# Patient Record
Sex: Female | Born: 1970 | Race: White | Hispanic: No | Marital: Single | Smoking: Former smoker
Health system: Southern US, Community
[De-identification: ages and names within clinical notes are randomized; demographics above are authoritative.]

## PROBLEM LIST (undated history)

## (undated) DIAGNOSIS — G4730 Sleep apnea, unspecified: Secondary | ICD-10-CM

## (undated) DIAGNOSIS — R87629 Unspecified abnormal cytological findings in specimens from vagina: Secondary | ICD-10-CM

## (undated) DIAGNOSIS — I471 Supraventricular tachycardia, unspecified: Secondary | ICD-10-CM

## (undated) DIAGNOSIS — E119 Type 2 diabetes mellitus without complications: Secondary | ICD-10-CM

## (undated) HISTORY — PX: KNEE SURGERY: SHX244

## (undated) HISTORY — DX: Unspecified abnormal cytological findings in specimens from vagina: R87.629

## (undated) HISTORY — PX: CHOLECYSTECTOMY: SHX55

---

## 2007-04-16 ENCOUNTER — Other Ambulatory Visit: Admission: RE | Admit: 2007-04-16 | Discharge: 2007-04-16 | Payer: Self-pay | Admitting: Obstetrics and Gynecology

## 2009-04-04 ENCOUNTER — Other Ambulatory Visit: Admission: RE | Admit: 2009-04-04 | Discharge: 2009-04-04 | Payer: Self-pay | Admitting: Obstetrics and Gynecology

## 2010-07-03 ENCOUNTER — Other Ambulatory Visit (HOSPITAL_COMMUNITY)
Admission: RE | Admit: 2010-07-03 | Discharge: 2010-07-03 | Disposition: A | Discharge status: Home / Self Care | Payer: Medicaid Other | Source: Ambulatory Visit | Attending: Obstetrics & Gynecology | Admitting: Obstetrics & Gynecology

## 2010-07-03 DIAGNOSIS — Z01419 Encounter for gynecological examination (general) (routine) without abnormal findings: Secondary | ICD-10-CM | POA: Insufficient documentation

## 2012-10-07 ENCOUNTER — Other Ambulatory Visit: Payer: Self-pay | Admitting: Obstetrics & Gynecology

## 2012-11-10 ENCOUNTER — Other Ambulatory Visit (HOSPITAL_COMMUNITY)
Admission: RE | Admit: 2012-11-10 | Discharge: 2012-11-10 | Disposition: A | Discharge status: Home / Self Care | Payer: Self-pay | Source: Ambulatory Visit | Attending: Obstetrics & Gynecology | Admitting: Obstetrics & Gynecology

## 2012-11-10 ENCOUNTER — Ambulatory Visit (INDEPENDENT_AMBULATORY_CARE_PROVIDER_SITE_OTHER): Payer: Self-pay | Admitting: Obstetrics & Gynecology

## 2012-11-10 ENCOUNTER — Encounter: Payer: Self-pay | Admitting: Obstetrics & Gynecology

## 2012-11-10 VITALS — BP 130/100 | Ht 66.0 in | Wt 285.0 lb

## 2012-11-10 DIAGNOSIS — Z01419 Encounter for gynecological examination (general) (routine) without abnormal findings: Secondary | ICD-10-CM

## 2012-11-10 DIAGNOSIS — R87810 Cervical high risk human papillomavirus (HPV) DNA test positive: Secondary | ICD-10-CM | POA: Insufficient documentation

## 2012-11-10 DIAGNOSIS — Z1151 Encounter for screening for human papillomavirus (HPV): Secondary | ICD-10-CM | POA: Insufficient documentation

## 2012-11-10 MED ORDER — ESCITALOPRAM OXALATE 20 MG PO TABS: 20.0000 mg | ORAL_TABLET | Freq: Every day | ORAL | Status: DC

## 2012-11-10 NOTE — Addendum Note (Signed)
Addended by: Colen Darling on: 11/10/2012 10:53 AM   Modules accepted: Orders

## 2012-11-10 NOTE — Progress Notes (Signed)
Patient ID: Christina Estrada, female   DOB: 1971/02/24, 42 y.o.   MRN: [id number] Subjective:     Christina Estrada is a 42 y.o. female here for a routine exam.  Patient's last menstrual period was 10/31/2012. G1P1001 Current complaints: none.  Personal health questionnaire reviewed: no.   Gynecologic History Patient's last menstrual period was 10/31/2012. Contraception: abstinence Last Pap: 2013. Results were: normal Last mammogram: 2013. Results were: normal  Obstetric History OB History   Grav Para Term Preterm Abortions TAB SAB Ect Mult Living   1 1 1  0 0 0 0 0 0 1     # Outc Date GA Lbr Len/2nd Wgt Sex Del Anes PTL Lv   1 TRM      LTCS          The following portions of the patient's history were reviewed and updated as appropriate: allergies, current medications, past family history, past medical history, past social history, past surgical history and problem list.  Review of Systems  Review of Systems  Constitutional: Negative for fever, chills, weight loss, malaise/fatigue and diaphoresis.  HENT: Negative for hearing loss, ear pain, nosebleeds, congestion, sore throat, neck pain, tinnitus and ear discharge.   Eyes: Negative for blurred vision, double vision, photophobia, pain, discharge and redness.  Respiratory: Negative for cough, hemoptysis, sputum production, shortness of breath, wheezing and stridor.   Cardiovascular: Negative for chest pain, palpitations, orthopnea, claudication, leg swelling and PND.  Gastrointestinal: negative for abdominal pain. Negative for heartburn, nausea, vomiting, diarrhea, constipation, blood in stool and melena.  Genitourinary: Negative for dysuria, urgency, frequency, hematuria and flank pain.  Musculoskeletal: Negative for myalgias, back pain, joint pain and falls.  Skin: Negative for itching and rash.  Neurological: Negative for dizziness, tingling, tremors, sensory change, speech change, focal weakness, seizures, loss of consciousness,  weakness and headaches.  Endo/Heme/Allergies: Negative for environmental allergies and polydipsia. Does not bruise/bleed easily.  Psychiatric/Behavioral: Negative for depression, suicidal ideas, hallucinations, memory loss and substance abuse. The patient is not nervous/anxious and does not have insomnia.        Objective:    Physical Exam  Vitals reviewed. Constitutional: She is oriented to person, place, and time. She appears well-developed and well-nourished.  HENT:  Head: Normocephalic and atraumatic.        Right Ear: External ear normal.  Left Ear: External ear normal.  Nose: Nose normal.  Mouth/Throat: Oropharynx is clear and moist.  Eyes: Conjunctivae and EOM are normal. Pupils are equal, round, and reactive to light. Right eye exhibits no discharge. Left eye exhibits no discharge. No scleral icterus.  Neck: Normal range of motion. Neck supple. No tracheal deviation present. No thyromegaly present.  Cardiovascular: Normal rate, regular rhythm, normal heart sounds and intact distal pulses.  Exam reveals no gallop and no friction rub.   No murmur heard. Respiratory: Effort normal and breath sounds normal. No respiratory distress. She has no wheezes. She has no rales. She exhibits no tenderness.  GI: Soft. Bowel sounds are normal. She exhibits no distension and no mass. There is no tenderness. There is no rebound and no guarding.  Genitourinary:       Vulva is normal without lesions Vagina is pink moist without discharge Cervix normal in appearance and pap is done Uterus is normal size shape and contour Adnexa is negative with normal sized ovaries   Musculoskeletal: Normal range of motion. She exhibits no edema and no tenderness.  Neurological: She is alert and oriented to person, place,  and time. She has normal reflexes. She displays normal reflexes. No cranial nerve deficit. She exhibits normal muscle tone. Coordination normal.  Skin: Skin is warm and dry. No rash noted. No  erythema. No pallor.  Psychiatric: She has a normal mood and affect. Her behavior is normal. Judgment and thought content normal.       Assessment:    Healthy female exam.    Plan:    Contraception: abstinence. Mammogram ordered. Follow up in: 1 year.

## 2012-11-10 NOTE — Patient Instructions (Signed)
Mammogram [id number] 

## 2012-11-25 ENCOUNTER — Ambulatory Visit (INDEPENDENT_AMBULATORY_CARE_PROVIDER_SITE_OTHER): Payer: Self-pay | Admitting: Obstetrics & Gynecology

## 2012-11-25 ENCOUNTER — Encounter: Payer: Self-pay | Admitting: Obstetrics & Gynecology

## 2012-11-25 VITALS — BP 140/100 | Wt 289.0 lb

## 2012-11-25 DIAGNOSIS — R87610 Atypical squamous cells of undetermined significance on cytologic smear of cervix (ASC-US): Secondary | ICD-10-CM

## 2012-11-25 DIAGNOSIS — IMO0002 Reserved for concepts with insufficient information to code with codable children: Secondary | ICD-10-CM | POA: Insufficient documentation

## 2012-11-25 DIAGNOSIS — R87810 Cervical high risk human papillomavirus (HPV) DNA test positive: Secondary | ICD-10-CM

## 2012-11-25 NOTE — Progress Notes (Signed)
Patient ID: Christina Estrada, female   DOB: October 25, 1970, 42 y.o.   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

## 2012-11-25 NOTE — Patient Instructions (Signed)
Cervical Dysplasia  Cervical dysplasia is a condition in which a woman has abnormal changes in the cells of her cervix. The cervix is the opening to the uterus (womb) between the vagina and the uterus. These changes are called cervical dysplasia and may be the first signs of cervical cancer. These cells can be taken from the cervix during a Pap test and then looked at under a microscope.  With early detection, treatment, and close follow-up care, nearly all cervical dysplasia can be cured. If untreated, the mild to moderate stages of dysplasia often grow more severe.   RISK FACTORS   The following increase the risk for cervical dysplasia.  · Having had a sexually transmitted disease, including:  · Chlamydia.  · Human papilloma virus (HPV).  · Becoming sexually active before age 18.  · Having had more than 1 sexual partner.  · Not using protection, such as condoms, during sexual intercourse, especially with new sexual partners.  · Having had cancer of the vagina or vulva.  · Having a sexual partner whose previous partner had cancer of the cervix or cervical dysplasia.  · Having a sexual partner who has or has had cancer of the penis.  · Having a weakened immune system (HIV, organ transplant).  · Being the daughter of a woman who took DES (diethylstilbestrol) during pregnancy.  · A history of cervical cancer in a woman's sister or mother.  · Smoking.  · Having had an abnormal Pap test in the past.  SYMPTOMS   There are usually no symptoms. If there are symptoms, they may be vague such as:  · Abnormal vaginal discharge.  · Bleeding between periods or following intercourse.  · Bleeding during menopause.  · Pain on intercourse (dyspareunia).  DIAGNOSIS   · The Pap test is the best way of detecting abnormalities of the cervix.  · Biopsy (removing a piece of tissue to look at under the microscope) of the cervix when the Pap test is abnormal or when the Pap test is normal, but the cervix looks abnormal.  TREATMENT    Catching and treating the changes early with Pap tests can prevent cervical cancer.  · Cryotherapy freezes the abnormal cells with a steel tip instrument.  · A laser can be used to remove the abnormal cells.  · Loop electrocautery excision procedure (LEEP). This procedure uses a heated electrical loop to remove a cone-like portion of the cervix, including the cervical canal.  · For more serious cases of cervical dysplasia, the abnormal tissue may be removed surgically by:  · A cone biopsy (by cold knife, laser or LEEP). A procedure in which a portion of the center of the cervix with the cervical canal is removed.  · The uterus and cervix are removed (hysterectomy).  Your caregiver will advise you regarding the need and timing of Pap tests in your follow-up. Women who have been treated for dysplasia should be closely followed with pelvic exams and Pap tests. During the first year following treatment of cervical dysplasia, Pap tests should be done every 3 to 4 months. In the second year, the schedule is every 6 months, or as recommended by your caregiver. See your caregiver for new or worsening problems.  HOME CARE INSTRUCTIONS   · Follow the instructions and recommendations of your caregiver regarding medicines and follow-up appointments.  · Only take over-the-counter or prescription medicines for pain or discomfort as directed by your caregiver.  · Cramping and pelvic discomfort may follow cryotherapy. It   is not abnormal to have watery discharge for several weeks after.  · Laser, cone surgery, cryotherapy or LEEP can cause a bad smelling vaginal discharge. It may also cause vaginal bleeding for a couple weeks following the procedure. The discharge may be black from the paste used to control bleeding from the cone site. This is normal.  · Do not use tampons, have sexual intercourse or douche until your caregiver says it is okay.  SEEK MEDICAL CARE IF:   · You develop genital warts.  · You need a prescription for  pain medicine following your treatment.  SEEK IMMEDIATE MEDICAL CARE IF:   · Your bleeding is heavier than a normal menstrual period.  · You develop bright red bleeding, especially if you have blood clots.  · You have a fever.  · You have increasing cramps or pain not relieved with medicine.  · You are lightheaded, unusually weak, or have fainting spells.  · You have abnormal vaginal discharge.  · You develop abdominal pain.  PREVENTION   · The surest way to prevent cervical dysplasia is to abstain from sexual intercourse.  · Practice safe sex, use condoms and have only one sex partner who does not have other sex partners.  · A Pap test is done to screen for cervical cancer.  · The first Pap test should be done at age 21.  · Between ages 21 and 29, Pap tests are repeated every 2 years.  · Beginning at age 30, you are advised to have a Pap test every 3 years as long as your past 3 Pap tests have been normal.  · Some women have medical problems that increase the chance of getting cervical cancer. Talk to your caregiver about these problems. It is especially important to talk to your caregiver if a new problem develops soon after your last Pap test. In these cases, your caregiver may recommend more frequent screening and Pap tests.  · The above recommendations are the same for women who have or have not gotten the vaccine for HPV (Human Papillomavirus).  · If you had a hysterectomy for a problem that was not a cancer or a condition that could lead to cancer, then you no longer need Pap tests. However, even if you no longer need a Pap test, a regular exam is a good idea to make sure no other problems are starting.     · If you are between ages 65 and 70, and you have had normal Pap tests going back 10 years, you no longer need Pap tests. However, even if you no longer need a Pap test, a regular exam is a good idea to make sure no other problems are starting.     · If you have had past treatment for cervical cancer or a  condition that could lead to cancer, you need Pap tests and screening for cancer for at least 20 years after your treatment.   · If Pap tests have been discontinued, risk factors (such as a new sexual partner)  need to be re-assessed to determine if screening should be resumed.  · Some women may need screenings more often if they are at high risk for cervical cancer.  · Your caregiver may do additional tests including:  · Colposcopy. A procedure in which a special microscope magnifies the cells and allows the provider to closely examine the cervix, vagina, and vulva.  · Biopsy. A small tissue sample is taken from the cervix, vagina or vulva. This is generally done in your   caregivers office.  · A cone biopsy (cold knife or laser). A large tissue sample is taken from the cervix. This procedure is usually done in an operating room under a general anesthetic. The cone often removes all abnormal tissue and so may also complete the treatment.  · LEEP, also removing a circular portion of the cervix and is done in a doctors office under a local anesthetic.  · Now there is a vaccine, Gardasil, that was developed to prevent the HPV'S that can cause cancer of the cervix and genital warts. It is recommended for females ages 9 to 26. It should not be given to pregnant women until more is known about its effects on the fetus. Not all cancers of the cervix are caused by the HPV. Routine gynecology exams and Pap tests should continue as recommended by your caregiver.  Document Released: 03/19/2005 Document Revised: 06/11/2011 Document Reviewed: 03/10/2008  [employer]® Patient Information ©2014 [employer].

## 2013-06-03 ENCOUNTER — Other Ambulatory Visit (HOSPITAL_COMMUNITY): Payer: Self-pay

## 2013-06-03 ENCOUNTER — Other Ambulatory Visit: Payer: Self-pay | Admitting: Physician Assistant

## 2013-06-03 DIAGNOSIS — G4730 Sleep apnea, unspecified: Secondary | ICD-10-CM

## 2013-07-10 ENCOUNTER — Ambulatory Visit: Payer: BC Managed Care – PPO | Attending: Neurology | Admitting: Sleep Medicine

## 2013-07-10 ENCOUNTER — Encounter: Payer: Self-pay | Admitting: Neurology

## 2013-07-10 VITALS — Ht 67.2 in | Wt 290.0 lb

## 2013-07-10 DIAGNOSIS — Z6841 Body Mass Index (BMI) 40.0 and over, adult: Secondary | ICD-10-CM | POA: Insufficient documentation

## 2013-07-10 DIAGNOSIS — G4730 Sleep apnea, unspecified: Secondary | ICD-10-CM

## 2013-07-10 DIAGNOSIS — G4733 Obstructive sleep apnea (adult) (pediatric): Secondary | ICD-10-CM | POA: Insufficient documentation

## 2013-07-16 NOTE — Sleep Study (Signed)
  Nelson A. Merlene Laughter, MD     [email or web address]          LOCATION: SLEEP LAB FACILITY: [facility]  PHYSICIAN: Paizley Ramella A. Merlene Laughter, M.D.   DATE OF STUDY: 07/10/13  NOCTURNAL POLYSOMNOGRAM   REFERRING PHYSICIAN: Rhone Ozaki.  INDICATIONS: This is a 43 year old presents with obesity, snoring and witnessed apnea.  MEDICATIONS:  Prior to Admission medications   Medication Sig Start Date End Date Taking? Authorizing Provider  escitalopram (LEXAPRO) 20 MG tablet Take 1 tablet (20 mg total) by mouth daily. 11/10/12   Florian Buff, MD      EPWORTH SLEEPINESS SCALE: 7.   BMI: 47.   ARCHITECTURAL SUMMARY: This is a split-night recording with initial portion been a diagnostic and a second person at titration recording.Total recording time was 421 minutes. Sleep efficiency 87 %. Sleep latency 13 minutes. REM latency 197 minutes. Stage NI 2 %, N2 57 % and N3 18 % and REM sleep 22 %.    RESPIRATORY DATA:  Baseline oxygen saturation is 96 %. The lowest saturation is 61 %. The diagnostic AHI is 81. The patient was started on positive pressure starting at 5 and increase to 10. The optimal pressures 10 with resolution of obstructive events and good tolerance.  LIMB MOVEMENT SUMMARY: PLM index 0.   ELECTROCARDIOGRAM SUMMARY: Average heart rate is 55 with no significant dysrhythmias observed.   IMPRESSION:  1. Severe obstructive sleep apnea syndrome which responds well to the CPAP of 10.  Thanks for this referral.  Soren Lazarz A. Merlene Laughter, M.D. Diplomat, [employer] adviser of [employer] Medicine.

## 2014-02-01 ENCOUNTER — Encounter: Payer: Self-pay | Admitting: Obstetrics & Gynecology

## 2014-02-02 ENCOUNTER — Other Ambulatory Visit (HOSPITAL_COMMUNITY): Payer: Self-pay | Admitting: Orthopedic Surgery

## 2014-02-02 DIAGNOSIS — M25561 Pain in right knee: Secondary | ICD-10-CM

## 2014-02-04 ENCOUNTER — Ambulatory Visit (HOSPITAL_COMMUNITY): Payer: BC Managed Care – PPO

## 2014-02-18 ENCOUNTER — Ambulatory Visit (HOSPITAL_COMMUNITY): Payer: BC Managed Care – PPO | Attending: Orthopedic Surgery

## 2014-09-08 ENCOUNTER — Emergency Department (HOSPITAL_COMMUNITY): Payer: Medicaid Other

## 2014-09-08 ENCOUNTER — Encounter (HOSPITAL_COMMUNITY): Payer: Self-pay | Admitting: Emergency Medicine

## 2014-09-08 ENCOUNTER — Emergency Department (HOSPITAL_COMMUNITY)
Admission: EM | Admit: 2014-09-08 | Discharge: 2014-09-08 | Disposition: A | Discharge status: Home / Self Care | Payer: Medicaid Other | Attending: Emergency Medicine | Admitting: Emergency Medicine

## 2014-09-08 DIAGNOSIS — M25461 Effusion, right knee: Secondary | ICD-10-CM | POA: Insufficient documentation

## 2014-09-08 DIAGNOSIS — M25561 Pain in right knee: Secondary | ICD-10-CM | POA: Insufficient documentation

## 2014-09-08 DIAGNOSIS — Z87891 Personal history of nicotine dependence: Secondary | ICD-10-CM | POA: Insufficient documentation

## 2014-09-08 DIAGNOSIS — Z79899 Other long term (current) drug therapy: Secondary | ICD-10-CM | POA: Insufficient documentation

## 2014-09-08 DIAGNOSIS — M25471 Effusion, right ankle: Secondary | ICD-10-CM | POA: Insufficient documentation

## 2014-09-08 LAB — CBC WITH DIFFERENTIAL/PLATELET
BASOS ABS: 0 10*3/uL (ref 0.0–0.1)
Basophils Relative: 0 % (ref 0–1)
EOS ABS: 0.3 10*3/uL (ref 0.0–0.7)
Eosinophils Relative: 2 % (ref 0–5)
HCT: 40.9 % (ref 36.0–46.0)
Hemoglobin: 13.6 g/dL (ref 12.0–15.0)
LYMPHS PCT: 23 % (ref 12–46)
Lymphs Abs: 2.8 10*3/uL (ref 0.7–4.0)
MCH: 28.9 pg (ref 26.0–34.0)
MCHC: 33.3 g/dL (ref 30.0–36.0)
MCV: 87 fL (ref 78.0–100.0)
Monocytes Absolute: 0.8 10*3/uL (ref 0.1–1.0)
Monocytes Relative: 7 % (ref 3–12)
Neutro Abs: 8.2 10*3/uL — ABNORMAL HIGH (ref 1.7–7.7)
Neutrophils Relative %: 68 % (ref 43–77)
Platelets: 235 10*3/uL (ref 150–400)
RBC: 4.7 MIL/uL (ref 3.87–5.11)
RDW: 13.6 % (ref 11.5–15.5)
WBC: 12.1 10*3/uL — ABNORMAL HIGH (ref 4.0–10.5)

## 2014-09-08 LAB — BASIC METABOLIC PANEL
Anion gap: 6 (ref 5–15)
BUN: 9 mg/dL (ref 6–20)
CO2: 26 mmol/L (ref 22–32)
Calcium: 8.6 mg/dL — ABNORMAL LOW (ref 8.9–10.3)
Chloride: 106 mmol/L (ref 101–111)
Creatinine, Ser: 0.8 mg/dL (ref 0.44–1.00)
GFR calc Af Amer: >60 mL/min (ref >60)
GFR calc non Af Amer: >60 mL/min (ref >60)
Glucose, Bld: 92 mg/dL (ref 65–99)
Potassium: 3.6 mmol/L (ref 3.5–5.1)
SODIUM: 138 mmol/L (ref 135–145)

## 2014-09-08 LAB — URIC ACID: Uric Acid, Serum: 3 mg/dL (ref 2.3–6.6)

## 2014-09-08 MED ORDER — METHYLPREDNISOLONE SODIUM SUCC 125 MG IJ SOLR
125.0000 mg | Freq: Once | INTRAMUSCULAR | Status: AC
  Administered 2014-09-08: 125 mg via INTRAMUSCULAR
  Filled 2014-09-08: qty 2

## 2014-09-08 MED ORDER — PREDNISONE 10 MG PO TABS: 20.0000 mg | ORAL_TABLET | Freq: Every day | ORAL | Status: DC

## 2014-09-08 MED ORDER — HYDROCODONE-ACETAMINOPHEN 5-325 MG PO TABS: 1.0000 | ORAL_TABLET | Freq: Four times a day (QID) | ORAL | Status: DC | PRN

## 2014-09-08 NOTE — Discharge Instructions (Signed)
Follow up with dr. Harrison next week. °

## 2014-09-08 NOTE — ED Notes (Signed)
Pt reports right knee swelling and right ankle swelling. Pt reports history of same. Pt denies any known injury.

## 2014-09-08 NOTE — ED Provider Notes (Signed)
CSN: [id number]     Arrival date & time 09/08/14  1613 History   First MD Initiated Contact with Patient 09/08/14 1700     Chief Complaint  Patient presents with  . Joint Swelling     (Consider location/radiation/quality/duration/timing/severity/associated sxs/prior Treatment) Patient is a 44 y.o. female presenting with knee pain. The history is provided by the patient (the pt complains of right knee and ankle pain).  Knee Pain Lower extremity pain location: right kneee. Pain details:    Radiates to:  Does not radiate   Severity:  Moderate   Onset quality:  Sudden   Timing:  Constant Associated symptoms: no back pain and no fatigue     History reviewed. No pertinent past medical history. Past Surgical History  Procedure Laterality Date  . Cesarean section    . Cholecystectomy     Family History  Problem Relation Age of Onset  . Hypertension Father   . Diabetes Neg Hx    History  Substance Use Topics  . Smoking status: Former Smoker -- 0.25 packs/day  . Smokeless tobacco: Not on file     Comment: never used snuff or chewing tobacco.  . Alcohol Use: Yes     Comment: occasional    OB History    Gravida Para Term Preterm AB TAB SAB Ectopic Multiple Living   1 1 1  0 0 0 0 0 0 1     Review of Systems  Constitutional: Negative for appetite change and fatigue.  HENT: Negative for congestion, ear discharge and sinus pressure.   Eyes: Negative for discharge.  Respiratory: Negative for cough.   Cardiovascular: Negative for chest pain.  Gastrointestinal: Negative for abdominal pain and diarrhea.  Genitourinary: Negative for frequency and hematuria.  Musculoskeletal: Negative for back pain.       Right knee and ankle pain  Skin: Negative for rash.  Neurological: Negative for seizures and headaches.  Psychiatric/Behavioral: Negative for hallucinations.      Allergies  Review of patient's allergies indicates no known allergies.  Home Medications   Prior to Admission  medications   Medication Sig Start Date End Date Taking? Authorizing Provider  ibuprofen (ADVIL,MOTRIN) 200 MG tablet Take 600-800 mg by mouth every 6 (six) hours as needed for mild pain or moderate pain.   Yes Historical Provider, MD  escitalopram (LEXAPRO) 20 MG tablet Take 1 tablet (20 mg total) by mouth daily. Patient not taking: Reported on 09/08/2014 11/10/12   Florian Buff, MD  HYDROcodone-acetaminophen (NORCO/VICODIN) 5-325 MG per tablet Take 1 tablet by mouth every 6 (six) hours as needed. 09/08/14   Milton Ferguson, MD  predniSONE (DELTASONE) 10 MG tablet Take 2 tablets (20 mg total) by mouth daily. 09/08/14   Milton Ferguson, MD   BP 141/84 mmHg  Pulse 78  Temp(Src) 98.2 F (36.8 C) (Oral)  Resp 18  Ht 5\' 7"  (1.702 m)  Wt 280 lb (127.007 kg)  BMI 43.84 kg/m2  SpO2 98%  LMP 08/24/2014 Physical Exam  Constitutional: She is oriented to person, place, and time. She appears well-developed.  HENT:  Head: Normocephalic.  Eyes: Conjunctivae and EOM are normal. No scleral icterus.  Neck: Neck supple. No thyromegaly present.  Cardiovascular: Normal rate and regular rhythm.  Exam reveals no gallop and no friction rub.   No murmur heard. Pulmonary/Chest: No stridor. She has no wheezes. She has no rales. She exhibits no tenderness.  Abdominal: She exhibits no distension. There is no tenderness. There is no rebound.  Musculoskeletal:  She exhibits edema.  Moderately swollen tender right knee and ankle  Lymphadenopathy:    She has no cervical adenopathy.  Neurological: She is oriented to person, place, and time. She exhibits normal muscle tone. Coordination normal.  Skin: No rash noted. No erythema.  Psychiatric: She has a normal mood and affect. Her behavior is normal.    ED Course  Procedures (including critical care time) Labs Review Labs Reviewed  CBC WITH DIFFERENTIAL/PLATELET - Abnormal; Notable for the following:    WBC 12.1 (*)    Neutro Abs 8.2 (*)    All other components within  normal limits  BASIC METABOLIC PANEL - Abnormal; Notable for the following:    Calcium 8.6 (*)    All other components within normal limits  URIC ACID    Imaging Review Dg Ankle Complete Right  09/08/2014   CLINICAL DATA:  Anterior right knee pain. Medial right ankle pain. Chronic ankle pain worsening.  EXAM: RIGHT ANKLE - COMPLETE 3+ VIEW  COMPARISON:  None.  FINDINGS: Ankle mortise intact. The talar dome is normal. No malleolar fracture. The calcaneus is normal. No significant arthropathy.  IMPRESSION: No acute osseous abnormality.   Electronically Signed   By: Suzy Bouchard M.D.   On: 09/08/2014 18:00   Dg Knee Complete 4 Views Right  09/08/2014   CLINICAL DATA:  44 year old female with anterior and lateral right-sided knee pain and swelling. Symptoms have been present intermittently over the past 30 years, but have worsened over the past month.  EXAM: RIGHT KNEE - COMPLETE 4+ VIEW  COMPARISON:  No priors.  FINDINGS: Subtle areas of sclerosis in both the medial and lateral tibial metaphysis. There is no evidence of fracture, dislocation, or joint effusion. There is no evidence of arthropathy or other focal bone abnormality. Soft tissues are unremarkable. Mild joint space narrowing, subchondral sclerosis and osteophyte formation is noted in a tricompartmental distribution, compatible with mild osteoarthritis.  IMPRESSION: 1. Subtle areas of sclerosis in the tibial metaphysis are of uncertain etiology and significance, but could be related to chronic bone infarcts. 2. No acute findings. 3. Mild tricompartmental osteoarthritis.   Electronically Signed   By: Vinnie Langton M.D.   On: 09/08/2014 18:00     EKG Interpretation None      MDM   Final diagnoses:  Knee pain, acute, right    Arthritic pain,  tx with prednisone,  hydrocone and ortho follow up    Milton Ferguson, MD 09/08/14 2020

## 2014-09-08 NOTE — ED Notes (Signed)
Patient verbalizes understanding of discharge instructions, prescription medications, home care and follow up care. Patient out of department at this time. 

## 2014-12-28 ENCOUNTER — Emergency Department (HOSPITAL_COMMUNITY)
Admission: EM | Admit: 2014-12-28 | Discharge: 2014-12-28 | Disposition: A | Discharge status: Home / Self Care | Payer: Medicaid Other | Attending: Emergency Medicine | Admitting: Emergency Medicine

## 2014-12-28 ENCOUNTER — Encounter (HOSPITAL_COMMUNITY): Payer: Self-pay | Admitting: Cardiology

## 2014-12-28 DIAGNOSIS — S4992XA Unspecified injury of left shoulder and upper arm, initial encounter: Secondary | ICD-10-CM | POA: Insufficient documentation

## 2014-12-28 DIAGNOSIS — S0003XA Contusion of scalp, initial encounter: Secondary | ICD-10-CM | POA: Insufficient documentation

## 2014-12-28 DIAGNOSIS — Y998 Other external cause status: Secondary | ICD-10-CM | POA: Insufficient documentation

## 2014-12-28 DIAGNOSIS — Z7952 Long term (current) use of systemic steroids: Secondary | ICD-10-CM | POA: Insufficient documentation

## 2014-12-28 DIAGNOSIS — Z87891 Personal history of nicotine dependence: Secondary | ICD-10-CM | POA: Insufficient documentation

## 2014-12-28 DIAGNOSIS — S40812A Abrasion of left upper arm, initial encounter: Secondary | ICD-10-CM | POA: Insufficient documentation

## 2014-12-28 DIAGNOSIS — M25512 Pain in left shoulder: Secondary | ICD-10-CM

## 2014-12-28 DIAGNOSIS — Y92410 Unspecified street and highway as the place of occurrence of the external cause: Secondary | ICD-10-CM | POA: Insufficient documentation

## 2014-12-28 DIAGNOSIS — S3992XA Unspecified injury of lower back, initial encounter: Secondary | ICD-10-CM | POA: Insufficient documentation

## 2014-12-28 DIAGNOSIS — S199XXA Unspecified injury of neck, initial encounter: Secondary | ICD-10-CM | POA: Insufficient documentation

## 2014-12-28 DIAGNOSIS — T148XXA Other injury of unspecified body region, initial encounter: Secondary | ICD-10-CM

## 2014-12-28 DIAGNOSIS — Y9389 Activity, other specified: Secondary | ICD-10-CM | POA: Insufficient documentation

## 2014-12-28 DIAGNOSIS — Z8669 Personal history of other diseases of the nervous system and sense organs: Secondary | ICD-10-CM | POA: Insufficient documentation

## 2014-12-28 HISTORY — DX: Sleep apnea, unspecified: G47.30

## 2014-12-28 MED ORDER — KETOROLAC TROMETHAMINE 30 MG/ML IJ SOLN
30.0000 mg | Freq: Once | INTRAMUSCULAR | Status: AC
  Administered 2014-12-28: 30 mg via INTRAVENOUS
  Filled 2014-12-28: qty 1

## 2014-12-28 NOTE — Discharge Instructions (Signed)
Keep wounds clean.  If you were given medicines take as directed.  If you are on coumadin or contraceptives realize their levels and effectiveness is altered by many different medicines.  If you have any reaction (rash, tongues swelling, other) to the medicines stop taking and see a physician.    If your blood pressure was elevated in the ER make sure you follow up for management with a primary doctor or return for chest pain, shortness of breath or stroke symptoms.  Please follow up as directed and return to the ER or see a physician for new or worsening symptoms.  Thank you. Filed Vitals:   12/28/14 0843  BP: 148/75  Pulse: 92  Temp: 97.9 F (36.6 C)  TempSrc: Oral  Resp: 13  Height: 5\' 7"  (1.702 m)  Weight: 280 lb (127.007 kg)  SpO2: 97%
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
## 2014-12-28 NOTE — ED Notes (Signed)
Cleaned scrapes to bilateral arms.

## 2015-04-29 ENCOUNTER — Other Ambulatory Visit (HOSPITAL_COMMUNITY): Payer: Self-pay | Admitting: Respiratory Therapy

## 2015-04-29 DIAGNOSIS — I10 Essential (primary) hypertension: Secondary | ICD-10-CM

## 2015-04-29 DIAGNOSIS — G4733 Obstructive sleep apnea (adult) (pediatric): Secondary | ICD-10-CM

## 2015-08-03 DIAGNOSIS — M1712 Unilateral primary osteoarthritis, left knee: Secondary | ICD-10-CM | POA: Diagnosis not present

## 2015-09-22 DIAGNOSIS — M1712 Unilateral primary osteoarthritis, left knee: Secondary | ICD-10-CM | POA: Diagnosis not present

## 2015-10-03 DIAGNOSIS — S93401A Sprain of unspecified ligament of right ankle, initial encounter: Secondary | ICD-10-CM | POA: Diagnosis not present

## 2015-10-03 DIAGNOSIS — M1990 Unspecified osteoarthritis, unspecified site: Secondary | ICD-10-CM | POA: Diagnosis not present

## 2015-10-03 DIAGNOSIS — X501XXA Overexertion from prolonged static or awkward postures, initial encounter: Secondary | ICD-10-CM | POA: Diagnosis not present

## 2015-10-03 DIAGNOSIS — Z791 Long term (current) use of non-steroidal anti-inflammatories (NSAID): Secondary | ICD-10-CM | POA: Diagnosis not present

## 2015-10-03 DIAGNOSIS — F17200 Nicotine dependence, unspecified, uncomplicated: Secondary | ICD-10-CM | POA: Diagnosis not present

## 2015-10-03 DIAGNOSIS — E669 Obesity, unspecified: Secondary | ICD-10-CM | POA: Diagnosis not present

## 2015-10-03 DIAGNOSIS — M25571 Pain in right ankle and joints of right foot: Secondary | ICD-10-CM | POA: Diagnosis not present

## 2015-11-04 DIAGNOSIS — N342 Other urethritis: Secondary | ICD-10-CM | POA: Diagnosis not present

## 2015-11-04 DIAGNOSIS — Z1389 Encounter for screening for other disorder: Secondary | ICD-10-CM | POA: Diagnosis not present

## 2015-11-04 DIAGNOSIS — R350 Frequency of micturition: Secondary | ICD-10-CM | POA: Diagnosis not present

## 2015-11-04 DIAGNOSIS — Z6841 Body Mass Index (BMI) 40.0 and over, adult: Secondary | ICD-10-CM | POA: Diagnosis not present

## 2015-11-13 ENCOUNTER — Ambulatory Visit: Payer: BLUE CROSS/BLUE SHIELD | Attending: Neurology | Admitting: Neurology

## 2015-11-13 DIAGNOSIS — G4733 Obstructive sleep apnea (adult) (pediatric): Secondary | ICD-10-CM | POA: Diagnosis not present

## 2015-11-13 DIAGNOSIS — I10 Essential (primary) hypertension: Secondary | ICD-10-CM | POA: Diagnosis not present

## 2015-11-27 NOTE — Procedures (Signed)
  [place] A. Merlene Laughter, MD     [email or web address]             NOCTURNAL POLYSOMNOGRAPHY   LOCATION: [facility]  Patient Name: Christina Estrada, Christina Estrada Date: 11/13/2015 Gender: Female D.O.B: September 01, 1970 Age (years): 15 Referring Provider: Not Available Height (inches): 66 Interpreting Physician: Phillips Odor MD, ABSM Weight (lbs): 285 RPSGT: Rosebud Poles BMI: 46 MRN: Neck Size: 17.50 CLINICAL INFORMATION The patient is referred for a split night study with BPAP. MEDICATIONS Medications taken by the patient : N/A  Medications administered by patient during sleep study : No sleep medicine administered.  Current Outpatient Prescriptions:  .  HYDROcodone-acetaminophen (NORCO/VICODIN) 5-325 MG per tablet, Take 1 tablet by mouth every 6 (six) hours as needed. (Patient not taking: Reported on 12/28/2014), Disp: 20 tablet, Rfl: 0 .  ibuprofen (ADVIL,MOTRIN) 200 MG tablet, Take 600-800 mg by mouth every 6 (six) hours as needed for mild pain or moderate pain., Disp: , Rfl:  .  predniSONE (DELTASONE) 10 MG tablet, Take 2 tablets (20 mg total) by mouth daily. (Patient not taking: Reported on 12/28/2014), Disp: 14 tablet, Rfl: 0  SLEEP STUDY TECHNIQUE As per the AASM Manual for the Scoring of Sleep and Associated Events v2.3 (April 2016) with a hypopnea requiring 4% desaturations. The channels recorded and monitored were frontal, central and occipital EEG, electrooculogram (EOG), submentalis EMG (chin), nasal and oral airflow, thoracic and abdominal wall motion, anterior tibialis EMG, snore microphone, electrocardiogram, and pulse oximetry. Bi-level positive airway pressure (BiPAP) was initiated when the patient met split night criteria and was titrated according to treat sleep-disordered breathing. RESPIRATORY PARAMETERS Diagnostic Total AHI (/hr): 86.1 RDI (/hr): 86.1 OA Index (/hr): 32.3 CA Index (/hr): 0.0 REM AHI (/hr): N/A NREM AHI (/hr): 86.1 Supine AHI  (/hr): 97.4 Non-supine AHI (/hr): 81.51 Min O2 Sat (%): 72.00 Mean O2 (%): 90.50 Time below 88% (min): 48.9   Titration Optimal IPAP Pressure (cm): 10 Optimal EPAP Pressure (cm):  AHI at Optimal Pressure (/hr): 0.0 Min O2 at Optimal Pressure (%): 93.0 Sleep % at Optimal (%): 100 Supine % at Optimal (%): 100     SLEEP ARCHITECTURE The study was initiated at 10:23:37 PM and terminated at 5:32:09 AM. The total recorded time was 428.5 minutes. EEG confirmed total sleep time was 339.7 minutes yielding a sleep efficiency of 79.3%. Sleep onset after lights out was 37.3 minutes with a REM latency of 225.5 minutes. The patient spent 2.50% of the night in stage N1 sleep, 60.26% in stage N2 sleep, 19.28% in stage N3 and 17.96% in REM. Wake after sleep onset (WASO) was 51.5 minutes. The Arousal Index was 35.1/hour. LEG MOVEMENT DATA The total Periodic Limb Movements of Sleep (PLMS) were 0. The PLMS index was 0.00 . CARDIAC DATA The 2 lead EKG demonstrated sinus rhythm. The mean heart rate was N/A beats per minute. Other EKG findings include: None.   IMPRESSIONS - Severe obstructive sleep apnea occurred during the diagnostic portion of the study (AHI = 86.1 /hour). An optimal PAP pressure was selected for this patient ( 10 / cm of water).    Delano Metz, MD Diplomate, American Board of Sleep Medicine.

## 2015-11-28 DIAGNOSIS — S83232D Complex tear of medial meniscus, current injury, left knee, subsequent encounter: Secondary | ICD-10-CM | POA: Diagnosis not present

## 2015-11-28 DIAGNOSIS — M1712 Unilateral primary osteoarthritis, left knee: Secondary | ICD-10-CM | POA: Diagnosis not present

## 2015-11-28 DIAGNOSIS — S83272D Complex tear of lateral meniscus, current injury, left knee, subsequent encounter: Secondary | ICD-10-CM | POA: Diagnosis not present

## 2015-12-12 DIAGNOSIS — M1712 Unilateral primary osteoarthritis, left knee: Secondary | ICD-10-CM | POA: Diagnosis not present

## 2015-12-12 DIAGNOSIS — Z9889 Other specified postprocedural states: Secondary | ICD-10-CM | POA: Diagnosis not present

## 2015-12-21 DIAGNOSIS — Z9889 Other specified postprocedural states: Secondary | ICD-10-CM | POA: Diagnosis not present

## 2015-12-21 DIAGNOSIS — M25571 Pain in right ankle and joints of right foot: Secondary | ICD-10-CM | POA: Diagnosis not present

## 2015-12-21 DIAGNOSIS — M1712 Unilateral primary osteoarthritis, left knee: Secondary | ICD-10-CM | POA: Diagnosis not present

## 2016-01-10 DIAGNOSIS — G4733 Obstructive sleep apnea (adult) (pediatric): Secondary | ICD-10-CM | POA: Diagnosis not present

## 2016-01-10 DIAGNOSIS — R030 Elevated blood-pressure reading, without diagnosis of hypertension: Secondary | ICD-10-CM | POA: Diagnosis not present

## 2016-01-10 DIAGNOSIS — G4710 Hypersomnia, unspecified: Secondary | ICD-10-CM | POA: Diagnosis not present

## 2016-01-16 DIAGNOSIS — G4733 Obstructive sleep apnea (adult) (pediatric): Secondary | ICD-10-CM | POA: Diagnosis not present

## 2016-02-16 DIAGNOSIS — G4733 Obstructive sleep apnea (adult) (pediatric): Secondary | ICD-10-CM | POA: Diagnosis not present

## 2016-03-14 DIAGNOSIS — G4710 Hypersomnia, unspecified: Secondary | ICD-10-CM | POA: Diagnosis not present

## 2016-03-14 DIAGNOSIS — R030 Elevated blood-pressure reading, without diagnosis of hypertension: Secondary | ICD-10-CM | POA: Diagnosis not present

## 2016-03-14 DIAGNOSIS — G4733 Obstructive sleep apnea (adult) (pediatric): Secondary | ICD-10-CM | POA: Diagnosis not present

## 2016-03-15 DIAGNOSIS — M1711 Unilateral primary osteoarthritis, right knee: Secondary | ICD-10-CM | POA: Diagnosis not present

## 2016-03-15 DIAGNOSIS — M25561 Pain in right knee: Secondary | ICD-10-CM | POA: Diagnosis not present

## 2016-03-15 DIAGNOSIS — G8929 Other chronic pain: Secondary | ICD-10-CM | POA: Diagnosis not present

## 2016-03-17 DIAGNOSIS — G4733 Obstructive sleep apnea (adult) (pediatric): Secondary | ICD-10-CM | POA: Diagnosis not present

## 2016-04-17 DIAGNOSIS — G4733 Obstructive sleep apnea (adult) (pediatric): Secondary | ICD-10-CM | POA: Diagnosis not present

## 2016-05-23 DIAGNOSIS — Z23 Encounter for immunization: Secondary | ICD-10-CM | POA: Diagnosis not present

## 2016-06-25 DIAGNOSIS — M25562 Pain in left knee: Secondary | ICD-10-CM | POA: Diagnosis not present

## 2016-06-25 DIAGNOSIS — M1712 Unilateral primary osteoarthritis, left knee: Secondary | ICD-10-CM | POA: Diagnosis not present

## 2016-06-27 DIAGNOSIS — M25562 Pain in left knee: Secondary | ICD-10-CM | POA: Diagnosis not present

## 2016-06-27 DIAGNOSIS — M1712 Unilateral primary osteoarthritis, left knee: Secondary | ICD-10-CM | POA: Diagnosis not present

## 2016-08-16 DIAGNOSIS — G4733 Obstructive sleep apnea (adult) (pediatric): Secondary | ICD-10-CM | POA: Diagnosis not present

## 2016-10-12 DIAGNOSIS — Z87891 Personal history of nicotine dependence: Secondary | ICD-10-CM | POA: Diagnosis not present

## 2016-10-12 DIAGNOSIS — M5431 Sciatica, right side: Secondary | ICD-10-CM | POA: Diagnosis not present

## 2016-10-12 DIAGNOSIS — Z6841 Body Mass Index (BMI) 40.0 and over, adult: Secondary | ICD-10-CM | POA: Diagnosis not present

## 2016-10-12 DIAGNOSIS — Z1389 Encounter for screening for other disorder: Secondary | ICD-10-CM | POA: Diagnosis not present

## 2017-03-08 DIAGNOSIS — Z1389 Encounter for screening for other disorder: Secondary | ICD-10-CM | POA: Diagnosis not present

## 2017-03-08 DIAGNOSIS — Z6841 Body Mass Index (BMI) 40.0 and over, adult: Secondary | ICD-10-CM | POA: Diagnosis not present

## 2017-03-08 DIAGNOSIS — J069 Acute upper respiratory infection, unspecified: Secondary | ICD-10-CM | POA: Diagnosis not present

## 2017-03-14 ENCOUNTER — Other Ambulatory Visit (HOSPITAL_COMMUNITY): Payer: Self-pay | Admitting: Family Medicine

## 2017-03-14 DIAGNOSIS — Z1231 Encounter for screening mammogram for malignant neoplasm of breast: Secondary | ICD-10-CM

## 2017-04-15 ENCOUNTER — Ambulatory Visit (HOSPITAL_COMMUNITY): Payer: BLUE CROSS/BLUE SHIELD

## 2017-04-22 ENCOUNTER — Ambulatory Visit (HOSPITAL_COMMUNITY): Payer: BLUE CROSS/BLUE SHIELD

## 2017-04-25 ENCOUNTER — Ambulatory Visit (HOSPITAL_COMMUNITY)
Admission: RE | Admit: 2017-04-25 | Discharge: 2017-04-25 | Disposition: A | Discharge status: Home / Self Care | Payer: BLUE CROSS/BLUE SHIELD | Source: Ambulatory Visit | Attending: Family Medicine | Admitting: Family Medicine

## 2017-04-25 DIAGNOSIS — R928 Other abnormal and inconclusive findings on diagnostic imaging of breast: Secondary | ICD-10-CM | POA: Diagnosis not present

## 2017-04-25 DIAGNOSIS — Z1231 Encounter for screening mammogram for malignant neoplasm of breast: Secondary | ICD-10-CM

## 2017-04-26 ENCOUNTER — Other Ambulatory Visit (HOSPITAL_COMMUNITY): Payer: Self-pay | Admitting: Family Medicine

## 2017-04-26 DIAGNOSIS — R928 Other abnormal and inconclusive findings on diagnostic imaging of breast: Secondary | ICD-10-CM

## 2017-04-29 DIAGNOSIS — Z1389 Encounter for screening for other disorder: Secondary | ICD-10-CM | POA: Diagnosis not present

## 2017-04-29 DIAGNOSIS — G4733 Obstructive sleep apnea (adult) (pediatric): Secondary | ICD-10-CM | POA: Diagnosis not present

## 2017-04-29 DIAGNOSIS — R7309 Other abnormal glucose: Secondary | ICD-10-CM | POA: Diagnosis not present

## 2017-04-29 DIAGNOSIS — Z6841 Body Mass Index (BMI) 40.0 and over, adult: Secondary | ICD-10-CM | POA: Diagnosis not present

## 2017-05-07 ENCOUNTER — Ambulatory Visit (HOSPITAL_COMMUNITY)
Admission: RE | Admit: 2017-05-07 | Discharge: 2017-05-07 | Disposition: A | Discharge status: Home / Self Care | Payer: BLUE CROSS/BLUE SHIELD | Source: Ambulatory Visit | Attending: Family Medicine | Admitting: Family Medicine

## 2017-05-07 DIAGNOSIS — R928 Other abnormal and inconclusive findings on diagnostic imaging of breast: Secondary | ICD-10-CM | POA: Insufficient documentation

## 2017-05-07 DIAGNOSIS — N6489 Other specified disorders of breast: Secondary | ICD-10-CM | POA: Diagnosis not present

## 2017-05-07 DIAGNOSIS — N6310 Unspecified lump in the right breast, unspecified quadrant: Secondary | ICD-10-CM | POA: Diagnosis not present

## 2017-06-06 DIAGNOSIS — Z6841 Body Mass Index (BMI) 40.0 and over, adult: Secondary | ICD-10-CM | POA: Diagnosis not present

## 2017-06-06 DIAGNOSIS — F17210 Nicotine dependence, cigarettes, uncomplicated: Secondary | ICD-10-CM | POA: Diagnosis not present

## 2017-06-06 DIAGNOSIS — G4733 Obstructive sleep apnea (adult) (pediatric): Secondary | ICD-10-CM | POA: Insufficient documentation

## 2017-06-06 DIAGNOSIS — F17200 Nicotine dependence, unspecified, uncomplicated: Secondary | ICD-10-CM | POA: Insufficient documentation

## 2017-06-07 ENCOUNTER — Other Ambulatory Visit: Payer: Self-pay | Admitting: Surgical Oncology

## 2017-06-07 DIAGNOSIS — K449 Diaphragmatic hernia without obstruction or gangrene: Secondary | ICD-10-CM

## 2017-06-11 DIAGNOSIS — Z6841 Body Mass Index (BMI) 40.0 and over, adult: Secondary | ICD-10-CM | POA: Diagnosis not present

## 2017-06-11 DIAGNOSIS — Z713 Dietary counseling and surveillance: Secondary | ICD-10-CM | POA: Diagnosis not present

## 2017-06-27 ENCOUNTER — Other Ambulatory Visit: Payer: BLUE CROSS/BLUE SHIELD

## 2017-07-09 DIAGNOSIS — Z6841 Body Mass Index (BMI) 40.0 and over, adult: Secondary | ICD-10-CM | POA: Diagnosis not present

## 2017-07-09 DIAGNOSIS — Z1389 Encounter for screening for other disorder: Secondary | ICD-10-CM | POA: Diagnosis not present

## 2017-07-09 DIAGNOSIS — E119 Type 2 diabetes mellitus without complications: Secondary | ICD-10-CM | POA: Diagnosis not present

## 2017-07-09 DIAGNOSIS — G4733 Obstructive sleep apnea (adult) (pediatric): Secondary | ICD-10-CM | POA: Diagnosis not present

## 2017-07-10 ENCOUNTER — Ambulatory Visit
Admission: RE | Admit: 2017-07-10 | Discharge: 2017-07-10 | Disposition: A | Discharge status: Home / Self Care | Payer: BLUE CROSS/BLUE SHIELD | Source: Ambulatory Visit | Attending: Surgical Oncology | Admitting: Surgical Oncology

## 2017-07-10 ENCOUNTER — Other Ambulatory Visit: Payer: Self-pay | Admitting: Surgical Oncology

## 2017-07-10 DIAGNOSIS — K449 Diaphragmatic hernia without obstruction or gangrene: Secondary | ICD-10-CM

## 2017-08-02 ENCOUNTER — Ambulatory Visit (HOSPITAL_COMMUNITY)
Admission: RE | Admit: 2017-08-02 | Discharge: 2017-08-02 | Disposition: A | Discharge status: Home / Self Care | Payer: BLUE CROSS/BLUE SHIELD | Source: Ambulatory Visit | Attending: Family Medicine | Admitting: Family Medicine

## 2017-08-02 ENCOUNTER — Other Ambulatory Visit (HOSPITAL_COMMUNITY): Payer: Self-pay | Admitting: Family Medicine

## 2017-08-02 DIAGNOSIS — Z6841 Body Mass Index (BMI) 40.0 and over, adult: Secondary | ICD-10-CM | POA: Diagnosis not present

## 2017-08-02 DIAGNOSIS — Z1389 Encounter for screening for other disorder: Secondary | ICD-10-CM | POA: Diagnosis not present

## 2017-08-02 DIAGNOSIS — M25571 Pain in right ankle and joints of right foot: Principal | ICD-10-CM

## 2017-08-02 DIAGNOSIS — G8929 Other chronic pain: Secondary | ICD-10-CM

## 2017-08-19 DIAGNOSIS — Z6841 Body Mass Index (BMI) 40.0 and over, adult: Secondary | ICD-10-CM | POA: Diagnosis not present

## 2017-12-05 DIAGNOSIS — Z23 Encounter for immunization: Secondary | ICD-10-CM | POA: Diagnosis not present

## 2017-12-05 DIAGNOSIS — M7989 Other specified soft tissue disorders: Secondary | ICD-10-CM | POA: Diagnosis not present

## 2017-12-05 DIAGNOSIS — Z1389 Encounter for screening for other disorder: Secondary | ICD-10-CM | POA: Diagnosis not present

## 2017-12-05 DIAGNOSIS — Z6841 Body Mass Index (BMI) 40.0 and over, adult: Secondary | ICD-10-CM | POA: Diagnosis not present

## 2017-12-13 ENCOUNTER — Other Ambulatory Visit: Payer: Self-pay

## 2017-12-13 ENCOUNTER — Emergency Department (HOSPITAL_COMMUNITY)
Admission: EM | Admit: 2017-12-13 | Discharge: 2017-12-13 | Disposition: A | Discharge status: Home / Self Care | Payer: BLUE CROSS/BLUE SHIELD | Attending: Emergency Medicine | Admitting: Emergency Medicine

## 2017-12-13 ENCOUNTER — Encounter (HOSPITAL_COMMUNITY): Payer: Self-pay | Admitting: Emergency Medicine

## 2017-12-13 DIAGNOSIS — L539 Erythematous condition, unspecified: Secondary | ICD-10-CM | POA: Diagnosis not present

## 2017-12-13 DIAGNOSIS — Z87891 Personal history of nicotine dependence: Secondary | ICD-10-CM | POA: Insufficient documentation

## 2017-12-13 DIAGNOSIS — L03811 Cellulitis of head [any part, except face]: Secondary | ICD-10-CM | POA: Diagnosis not present

## 2017-12-13 LAB — I-STAT CHEM 8, ED
BUN: 9 mg/dL (ref 6–20)
CHLORIDE: 103 mmol/L (ref 98–111)
Calcium, Ion: 1.12 mmol/L — ABNORMAL LOW (ref 1.15–1.40)
Creatinine, Ser: 0.7 mg/dL (ref 0.44–1.00)
Glucose, Bld: 97 mg/dL (ref 70–99)
HCT: 43 % (ref 36.0–46.0)
Hemoglobin: 14.6 g/dL (ref 12.0–15.0)
Potassium: 3.7 mmol/L (ref 3.5–5.1)
SODIUM: 140 mmol/L (ref 135–145)
TCO2: 26 mmol/L (ref 22–32)

## 2017-12-13 MED ORDER — CEPHALEXIN 500 MG PO CAPS: 500.0000 mg | ORAL_CAPSULE | Freq: Four times a day (QID) | ORAL | 0 refills | Status: DC

## 2017-12-13 MED ORDER — CEPHALEXIN 500 MG PO CAPS
500.0000 mg | ORAL_CAPSULE | Freq: Once | ORAL | Status: AC
  Administered 2017-12-13: 500 mg via ORAL
  Filled 2017-12-13: qty 1

## 2017-12-13 NOTE — ED Triage Notes (Signed)
Pt reports recently stopped taking celebryx and reports "whelp and face started swelling on right side of forehead." mild redness noted to right side of head. nad noted. Airway patent.

## 2017-12-13 NOTE — Discharge Instructions (Addendum)
As discussed you are being treated for a skin infection called cellulitis with antibiotic prescribed although the area of redness may be a simple inflammation reaction to a suspected insect bite.  You may continue using your hydrocortisone cream topically to the site.  Warm compresses may also be helpful several times daily.  Get rechecked for any worsening redness, swelling or pain that is not responding within 48 hours to the antibiotic.  Your lab tests today are normal with a normal kidney function.  Continue using your Lasix as recommended by your primary doctor.  You may also use elevation as much as possible to help reduce the swelling in your legs.

## 2017-12-13 NOTE — ED Notes (Signed)
Patient states she started Lasix a week ago and is suspicious of a reaction to started this med. Right face swollen and red. Gland enlargement noted on right side. States symptoms started yesterday.

## 2017-12-14 NOTE — ED Provider Notes (Signed)
[facility] EMERGENCY DEPARTMENT Provider Note   CSN: [id number] Arrival date & time: 12/13/17  4709     History   Chief Complaint Chief Complaint  Patient presents with  . Facial Swelling    HPI Christina Estrada is a 47 y.o. female with no significant past medical history presenting with a tender erythematous patch in her right frontal scalp and forehead which she woke with today.  She describes slightly pruritic lesion but also tender to palpation.  She is also discovered several knots around her right ear which are tender to palpation.  She denies any other areas of involvement, no history of rash or hives.  She does endorse several medication changes over the past 2 weeks, she has stopped taking Celebrex and her PCP started her on Lasix secondary to bilateral lower extremity swelling.  She states the Lasix is helping although her swelling is not completely reduced yet.  She denies fevers or chills, no sore throat, no headache and no mouth tongue or throat swelling, she denies shortness of breath wheezing or chest pain.  The history is provided by the patient.    Past Medical History:  Diagnosis Date  . Sleep apnea     Patient Active Problem List   Diagnosis Date Noted  . 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 CESAREAN SECTION    . CHOLECYSTECTOMY       OB History    Gravida  1   Para  1   Term  1   Preterm  0   AB  0   Living  1     SAB  0   TAB  0   Ectopic  0   Multiple  0   Live Births               Home Medications    Prior to Admission medications   Medication Sig Start Date End Date Taking? Authorizing Provider  cephALEXin (KEFLEX) 500 MG capsule Take 1 capsule (500 mg total) by mouth 4 (four) times daily. 12/13/17   Evalee Jefferson, PA-C  HYDROcodone-acetaminophen (NORCO/VICODIN) 5-325 MG per tablet Take 1 tablet by mouth every 6 (six) hours as needed. Patient not taking:  Reported on 12/28/2014 09/08/14   Milton Ferguson, MD  ibuprofen (ADVIL,MOTRIN) 200 MG tablet Take 600-800 mg by mouth every 6 (six) hours as needed for mild pain or moderate pain.    [provider]  predniSONE (DELTASONE) 10 MG tablet Take 2 tablets (20 mg total) by mouth daily. Patient not taking: Reported on 12/28/2014 09/08/14   Milton Ferguson, MD    Family History Family History  Problem Relation Age of Onset  . Hypertension Father   . Diabetes Neg Hx     Social History Social History   Tobacco Use  . Smoking status: Former Smoker    Packs/day: 0.25  . Smokeless tobacco: Never Used  . Tobacco comment: never used snuff or chewing tobacco.  Substance Use Topics  . Alcohol use: Yes    Comment: occasional   . Drug use: No     Allergies   Patient has no known allergies.   Review of Systems Review of Systems  Constitutional: Negative for chills and fever.  HENT: Negative for sore throat, trouble swallowing and voice change.   Respiratory: Negative for cough, choking, chest tightness, shortness of breath, wheezing and stridor.   Cardiovascular: Positive for leg swelling. Negative  for chest pain.  Skin: Positive for color change and rash.  Neurological: Negative for numbness.     Physical Exam Updated Vital Signs BP (!) 134/102 (BP Location: Right Arm)   Pulse 78   Temp 98 F (36.7 C) (Oral)   Resp 20   Ht 5\' 7"  (1.702 m)   Wt (!) 138.3 kg   LMP 11/22/2017   SpO2 96%   BMI 47.77 kg/m   Physical Exam  Constitutional: She appears well-developed and well-nourished. No distress.  HENT:  Head: Normocephalic.  Right Ear: Tympanic membrane, external ear and ear canal normal. No mastoid tenderness.  Left Ear: Tympanic membrane, external ear and ear canal normal. No mastoid tenderness.  Mouth/Throat: Uvula is midline, oropharynx is clear and moist and mucous membranes are normal. No uvula swelling. No posterior oropharyngeal edema.  Neck: Trachea normal, normal  range of motion and phonation normal. Neck supple.  Cardiovascular: Normal rate.  Pulmonary/Chest: Effort normal. No stridor. She has no wheezes.  Musculoskeletal: Normal range of motion. She exhibits no edema.  Lymphadenopathy:    She has cervical adenopathy.       Right cervical: Superficial cervical adenopathy present.  Right superior anterior chain adenopathy .   Skin: Rash noted. Rash is macular.  Patient has one isolated macular erythematous lesion over right frontal scalp, approximately 3 cm in diameter overlapping forehead by about 0.5 cm.  There is no induration or fluctuance.  No punctum, pointing or tenting.  Patient has no other areas of erythema or rash.     ED Treatments / Results  Labs (all labs ordered are listed, but only abnormal results are displayed) Labs Reviewed  I-STAT CHEM 8, ED - Abnormal; Notable for the following components:      Result Value   Calcium, Ion 1.12 (*)    All other components within normal limits    EKG None  Radiology No results found.  Procedures Procedures (including critical care time)  Medications Ordered in ED Medications  cephALEXin (KEFLEX) capsule 500 mg (500 mg Oral Given 12/13/17 1245)     Initial Impression / Assessment and Plan / ED Course  I have reviewed the triage vital signs and the nursing notes.  Pertinent labs & imaging results that were available during my care of the patient were reviewed by me and considered in my medical decision making (see chart for details).     Patient with isolated erythematous itchy and tender lesion right scalp and forehead, I suspect she may have been bit by an unknown insect.  Findings may be simply localized inflammatory reaction, but will cover for possible early cellulitis.  She was started on Keflex, discussed warm compresses to the site.  Mother at the bedside also very concerned over patient's new medication Lasix.  Although patient endorses that she is obtaining improvement in  her lower edema, she had no blood tests drawn prior to starting this medication.  An i-STAT was drawn here today, reassurance given that her kidney functions are normal range.  Endorsed that she continue with her Lasix as recommended by her PCP.  There is no indication that her presentation today is from allergic reaction since she has one isolated lesion, not pattern c/w drug rash.  This was discussed with patient who understands and is agreeable with plan.  Planned follow-up with her PCP if symptoms persist or worsen.  Final Clinical Impressions(s) / ED Diagnoses   Final diagnoses:  Cellulitis of head except face    ED Discharge  Orders         Ordered    cephALEXin (KEFLEX) 500 MG capsule  4 times daily     12/13/17 1238           Evalee Jefferson, Hershal Coria 12/14/17 1761    Virgel Manifold, MD 12/14/17 1133

## 2017-12-18 DIAGNOSIS — B029 Zoster without complications: Secondary | ICD-10-CM | POA: Diagnosis not present

## 2017-12-18 DIAGNOSIS — R7309 Other abnormal glucose: Secondary | ICD-10-CM | POA: Diagnosis not present

## 2017-12-18 DIAGNOSIS — E119 Type 2 diabetes mellitus without complications: Secondary | ICD-10-CM | POA: Diagnosis not present

## 2017-12-18 DIAGNOSIS — Z6841 Body Mass Index (BMI) 40.0 and over, adult: Secondary | ICD-10-CM | POA: Diagnosis not present

## 2017-12-18 DIAGNOSIS — Z1389 Encounter for screening for other disorder: Secondary | ICD-10-CM | POA: Diagnosis not present

## 2018-01-02 DIAGNOSIS — D229 Melanocytic nevi, unspecified: Secondary | ICD-10-CM | POA: Diagnosis not present

## 2018-01-02 DIAGNOSIS — L821 Other seborrheic keratosis: Secondary | ICD-10-CM | POA: Diagnosis not present

## 2018-01-02 DIAGNOSIS — B029 Zoster without complications: Secondary | ICD-10-CM | POA: Diagnosis not present

## 2018-06-02 DIAGNOSIS — Z86018 Personal history of other benign neoplasm: Secondary | ICD-10-CM

## 2018-08-12 DIAGNOSIS — G4733 Obstructive sleep apnea (adult) (pediatric): Secondary | ICD-10-CM | POA: Diagnosis not present

## 2018-10-02 DIAGNOSIS — G4733 Obstructive sleep apnea (adult) (pediatric): Secondary | ICD-10-CM | POA: Diagnosis not present

## 2018-10-02 DIAGNOSIS — R7309 Other abnormal glucose: Secondary | ICD-10-CM | POA: Diagnosis not present

## 2018-10-02 DIAGNOSIS — Z1389 Encounter for screening for other disorder: Secondary | ICD-10-CM | POA: Diagnosis not present

## 2018-10-02 DIAGNOSIS — Z0001 Encounter for general adult medical examination with abnormal findings: Secondary | ICD-10-CM | POA: Diagnosis not present

## 2018-10-02 DIAGNOSIS — Z23 Encounter for immunization: Secondary | ICD-10-CM | POA: Diagnosis not present

## 2018-10-02 DIAGNOSIS — Z6841 Body Mass Index (BMI) 40.0 and over, adult: Secondary | ICD-10-CM | POA: Diagnosis not present

## 2018-10-21 DIAGNOSIS — G4733 Obstructive sleep apnea (adult) (pediatric): Secondary | ICD-10-CM | POA: Diagnosis not present

## 2018-11-25 ENCOUNTER — Other Ambulatory Visit: Payer: BLUE CROSS/BLUE SHIELD | Admitting: Adult Health

## 2018-12-03 ENCOUNTER — Other Ambulatory Visit: Payer: Self-pay

## 2018-12-03 DIAGNOSIS — I872 Venous insufficiency (chronic) (peripheral): Secondary | ICD-10-CM

## 2018-12-09 ENCOUNTER — Encounter: Payer: Self-pay | Admitting: Vascular Surgery

## 2018-12-09 ENCOUNTER — Ambulatory Visit (INDEPENDENT_AMBULATORY_CARE_PROVIDER_SITE_OTHER): Payer: BC Managed Care – PPO | Admitting: Vascular Surgery

## 2018-12-09 ENCOUNTER — Other Ambulatory Visit: Payer: Self-pay

## 2018-12-09 ENCOUNTER — Ambulatory Visit (HOSPITAL_COMMUNITY)
Admission: RE | Admit: 2018-12-09 | Discharge: 2018-12-09 | Disposition: A | Discharge status: Home / Self Care | Payer: BC Managed Care – PPO | Source: Ambulatory Visit | Attending: Vascular Surgery | Admitting: Vascular Surgery

## 2018-12-09 DIAGNOSIS — I872 Venous insufficiency (chronic) (peripheral): Secondary | ICD-10-CM | POA: Diagnosis not present

## 2018-12-09 DIAGNOSIS — M7989 Other specified soft tissue disorders: Secondary | ICD-10-CM

## 2018-12-09 NOTE — Progress Notes (Signed)
Patient name: Christina Estrada MRN: [id number] DOB: 01/24/1971 Sex: female  REASON FOR CONSULT: Lower extremity edema and venous insufficiency  HPI: Christina Estrada is a 48 y.o. female, with history of obstructive sleep apnea that presents for evaluation of lower extremity edema and possible venous insufficiency.  Patient states she has had swelling to both lower extremities for some time.  She has no pain in her legs.  Specifically no pain when walking.  She also has no heaviness, achiness, burning, stinging etc.  She does not feel that one leg swells more than the other.  Her primary care doctor did start her on a fluid pill.  She has been trying to lose weight.  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  Denies any history of DVT.  No history of trauma.  Past Medical History:  Diagnosis Date  . Sleep apnea     Past Surgical History:  Procedure Laterality Date  . CESAREAN SECTION    . CHOLECYSTECTOMY    . KNEE SURGERY     2017    Family History  Problem Relation Age of Onset  . Hypertension Father   . Diabetes Neg Hx     SOCIAL HISTORY: Social History   Socioeconomic History  . Marital status: Single    Spouse name: Not on file  . Number of children: Not on file  . Years of education: Not on file  . Highest education level: Not on file  Occupational History  . Not on file  Social Needs  . Financial resource strain: Not on file  . Food insecurity    Worry: Not on file    Inability: Not on file  . Transportation needs    Medical: Not on file    Non-medical: Not on file  Tobacco Use  . Smoking status: Former Smoker    Packs/day: 0.25  . Smokeless tobacco: Never Used  . Tobacco comment: never used snuff or chewing tobacco.  Substance and Sexual Activity  . Alcohol use: Yes    Comment: occasional   . Drug use: No  . Sexual activity: Yes    Birth control/protection: None  Lifestyle  . Physical activity    Days per week: Not on file    Minutes per  session: Not on file  . Stress: Not on file  Relationships  . Social Herbalist on phone: Not on file    Gets together: Not on file    Attends religious service: Not on file    Active member of club or organization: Not on file    Attends meetings of clubs or organizations: Not on file    Relationship status: Not on file  . Intimate partner violence    Fear of current or ex partner: Not on file    Emotionally abused: Not on file    Physically abused: Not on file    Forced sexual activity: Not on file  Other Topics Concern  . Not on file  Social History Narrative  . Not on file    No Known Allergies  Current Outpatient Medications  Medication Sig Dispense Refill  . buPROPion (WELLBUTRIN) 75 MG tablet Take 75 mg by mouth 2 (two) times daily.    . furosemide (LASIX) 20 MG tablet Take 20 mg by mouth.    Marland Kitchen ibuprofen (ADVIL,MOTRIN) 200 MG tablet Take 600-800 mg by mouth every 6 (six) hours as needed for mild pain or moderate pain.    Marland Kitchen  cephALEXin (KEFLEX) 500 MG capsule Take 1 capsule (500 mg total) by mouth 4 (four) times daily. (Patient not taking: Reported on 12/09/2018) 40 capsule 0  . HYDROcodone-acetaminophen (NORCO/VICODIN) 5-325 MG per tablet Take 1 tablet by mouth every 6 (six) hours as needed. (Patient not taking: Reported on 12/09/2018) 20 tablet 0  . predniSONE (DELTASONE) 10 MG tablet Take 2 tablets (20 mg total) by mouth daily. (Patient not taking: Reported on 12/09/2018) 14 tablet 0   No current facility-administered medications for this visit.     REVIEW OF SYSTEMS:  [X]  denotes positive finding, [ ]  denotes negative finding Cardiac  Comments:  Chest pain or chest pressure:    Shortness of breath upon exertion:    Short of breath when lying flat:    Irregular heart rhythm:        Vascular    Pain in calf, thigh, or hip brought on by ambulation:    Pain in feet at night that wakes you up from your sleep:     Blood clot in your veins:    Leg swelling:  x  Bilateral      Pulmonary    Oxygen at home:    Productive cough:     Wheezing:         Neurologic    Sudden weakness in arms or legs:     Sudden numbness in arms or legs:     Sudden onset of difficulty speaking or slurred speech:    Temporary loss of vision in one eye:     Problems with dizziness:         Gastrointestinal    Blood in stool:     Vomited blood:         Genitourinary    Burning when urinating:     Blood in urine:        Psychiatric    Major depression:         Hematologic    Bleeding problems:    Problems with blood clotting too easily:        Skin    Rashes or ulcers:        Constitutional    Fever or chills:      PHYSICAL EXAM: Vitals:   12/09/18 1348  BP: (!) 157/100  Pulse: 91  Resp: 18  Temp: 97.9 F (36.6 C)  TempSrc: Temporal  SpO2: 97%  Weight: (!) 309 lb (140.2 kg)  Height: 5\' 7"  (1.702 m)    GENERAL: The patient is a well-nourished female, in no acute distress. The vital signs are documented above. CARDIAC: There is a regular rate and rhythm.  VASCULAR:  2+ femoral pulses palpable bilaterally 2+ palpable DP/PT pulses bilaterally Some spider veins on upper thighs - no varicosities PULMONARY: There is good air exchange bilaterally without wheezing or rales. ABDOMEN: Soft and non-tender with normal pitched bowel sounds.  Obese. MUSCULOSKELETAL: There are no major deformities or cyanosis. NEUROLOGIC: No focal weakness or paresthesias are detected. SKIN: There are no ulcers or rashes noted. PSYCHIATRIC: The patient has a normal affect.  DATA:   I independently reviewed her venous reflux study and she has no evidence of venous insufficiency and no evidence of DVT.  Assessment/Plan:  48 year old female who presents for evaluation of lower extremity swelling.  Discussed with her in detail that after review of her venous reflux study she has no evidence of venous insufficiency.  I discussed that I do not think she has any arterial  insufficiency either  given bounding dorsalis pedis and posterior tibial pulses at her ankle on exam.  Discussed other conservative measures like weight loss as well as leg elevation that may help her swelling over the long term.  Does not look like overt lymphedema to me either.  She can follow-up PRN.   Marty Heck, MD [facility] Office: [phone] Pager: [phone]

## 2018-12-10 DIAGNOSIS — F329 Major depressive disorder, single episode, unspecified: Secondary | ICD-10-CM | POA: Diagnosis not present

## 2018-12-10 DIAGNOSIS — Z6841 Body Mass Index (BMI) 40.0 and over, adult: Secondary | ICD-10-CM | POA: Diagnosis not present

## 2018-12-10 DIAGNOSIS — E669 Obesity, unspecified: Secondary | ICD-10-CM | POA: Diagnosis not present

## 2018-12-10 DIAGNOSIS — N946 Dysmenorrhea, unspecified: Secondary | ICD-10-CM | POA: Diagnosis not present

## 2018-12-25 DIAGNOSIS — F329 Major depressive disorder, single episode, unspecified: Secondary | ICD-10-CM | POA: Diagnosis not present

## 2018-12-25 DIAGNOSIS — Z6841 Body Mass Index (BMI) 40.0 and over, adult: Secondary | ICD-10-CM | POA: Diagnosis not present

## 2018-12-25 DIAGNOSIS — N951 Menopausal and female climacteric states: Secondary | ICD-10-CM | POA: Diagnosis not present

## 2019-02-03 ENCOUNTER — Telehealth: Payer: Self-pay | Admitting: Adult Health

## 2019-02-03 NOTE — Telephone Encounter (Signed)

## 2019-02-04 ENCOUNTER — Other Ambulatory Visit (HOSPITAL_COMMUNITY)
Admission: RE | Admit: 2019-02-04 | Discharge: 2019-02-04 | Disposition: A | Discharge status: Home / Self Care | Payer: BC Managed Care – PPO | Source: Ambulatory Visit | Attending: Adult Health | Admitting: Adult Health

## 2019-02-04 ENCOUNTER — Ambulatory Visit (INDEPENDENT_AMBULATORY_CARE_PROVIDER_SITE_OTHER): Payer: BC Managed Care – PPO | Admitting: Adult Health

## 2019-02-04 ENCOUNTER — Other Ambulatory Visit: Payer: Self-pay

## 2019-02-04 ENCOUNTER — Encounter: Payer: Self-pay | Admitting: Adult Health

## 2019-02-04 VITALS — BP 132/98 | HR 89 | Ht 67.0 in | Wt 310.0 lb

## 2019-02-04 DIAGNOSIS — R030 Elevated blood-pressure reading, without diagnosis of hypertension: Secondary | ICD-10-CM

## 2019-02-04 DIAGNOSIS — Z1211 Encounter for screening for malignant neoplasm of colon: Secondary | ICD-10-CM

## 2019-02-04 DIAGNOSIS — Z01419 Encounter for gynecological examination (general) (routine) without abnormal findings: Secondary | ICD-10-CM

## 2019-02-04 DIAGNOSIS — Z1212 Encounter for screening for malignant neoplasm of rectum: Secondary | ICD-10-CM

## 2019-02-04 DIAGNOSIS — N951 Menopausal and female climacteric states: Secondary | ICD-10-CM

## 2019-02-04 DIAGNOSIS — M7989 Other specified soft tissue disorders: Secondary | ICD-10-CM

## 2019-02-04 LAB — HEMOCCULT GUIAC POC 1CARD (OFFICE): Fecal Occult Blood, POC: NEGATIVE

## 2019-02-04 NOTE — Progress Notes (Addendum)
Patient ID: SENAYA LINLEY, female   DOB: 1970-12-04, 48 y.o.   MRN: [id number] History of Present Illness: Kashiya is a 48 year old white female, single, G1P1, in for a well woman gyn exam and pap.Her last pap was ASCUS +HPV 11/10/12, and had negative colpo 11/25/12. PCP is [facility].   Current Medications, Allergies, Past Medical History, Past Surgical History, Family History and Social History were reviewed in [employer] record.     Review of Systems: Patient denies any headaches, hearing loss, fatigue, blurred vision, shortness of breath, chest pain, abdominal pain, problems with bowel movements, urination, or intercourse(not active). No joint pain or mood swings. Some depression is on meds Has sleep apnea and uses C pap No period since April, has rx for HRT but has not started Has swelling in feet and ankles at times.   Physical Exam:BP (!) 132/98 (BP Location: Left Arm, Cuff Size: Large)   Pulse 89   Ht 5\' 7"  (1.702 m)   Wt (!) 310 lb (140.6 kg)   LMP 07/04/2018   BMI 48.55 kg/m  General:  Well developed, well nourished, no acute distress Skin:  Warm and dry Neck:  Midline trachea, normal thyroid, good ROM, no lymphadenopathy Lungs; Clear to auscultation bilaterally Breast:  No dominant palpable mass, retraction, or nipple discharge Cardiovascular: Regular rate and rhythm Abdomen:  Soft, non tender, no hepatosplenomegaly Pelvic:  External genitalia is normal in appearance, no lesions.  The vagina is normal in appearance. Urethra has no lesions or masses. The cervix is bulbous. Pap with high risk HPV 16/18 genotyping performed. Uterus is felt to be normal size, shape, and contour.  No adnexal masses or tenderness noted.Bladder is non tender, no masses felt. Rectal: Good sphincter tone, no polyps, or hemorrhoids felt.  Hemoccult negative. Extremities/musculoskeletal:  No varicosities noted, no clubbing or cyanosis.Has 1+edema BLE, and skin changes right  shin. Psych:  No mood changes, alert and cooperative,seems happy Fall risk is low PHQ 2 score 0. Co exam with Weyman Croon FNP student.  Impression and Plan: 1. Encounter for gynecological examination with Papanicolaou smear of cervix Pap sent Physical in 1 year Pap in 3 if normal Mammogram every 2 years Labs with PCP    2. Screening for colorectal cancer  3. Elevated BP without diagnosis of hypertension Follow up with Delman Cheadle PA,  Decrease salt and sugar  4. Leg swelling  5. Morbid obesity ([place])  6. Perimenopause Review handout on menopause  Follow up with Delman Cheadle PA about HRT

## 2019-02-04 NOTE — Patient Instructions (Signed)
Menopause Menopause is the normal time of life when menstrual periods stop completely. It is usually confirmed by 12 months without a menstrual period. The transition to menopause (perimenopause) most often happens between the ages of 45 and 55. During perimenopause, hormone levels change in your body, which can cause symptoms and affect your health. Menopause may increase your risk for:  Loss of bone (osteoporosis), which causes bone breaks (fractures).  Depression.  Hardening and narrowing of the arteries (atherosclerosis), which can cause heart attacks and strokes. What are the causes? This condition is usually caused by a natural change in hormone levels that happens as you get older. The condition may also be caused by surgery to remove both ovaries (bilateral oophorectomy). What increases the risk? This condition is more likely to start at an earlier age if you have certain medical conditions or treatments, including:  A tumor of the pituitary gland in the brain.  A disease that affects the ovaries and hormone production.  Radiation treatment for cancer.  Certain cancer treatments, such as chemotherapy or hormone (anti-estrogen) therapy.  Heavy smoking and excessive alcohol use.  Family history of early menopause. This condition is also more likely to develop earlier in women who are very thin. What are the signs or symptoms? Symptoms of this condition include:  Hot flashes.  Irregular menstrual periods.  Night sweats.  Changes in feelings about sex. This could be a decrease in sex drive or an increased comfort around your sexuality.  Vaginal dryness and thinning of the vaginal walls. This may cause painful intercourse.  Dryness of the skin and development of wrinkles.  Headaches.  Problems sleeping (insomnia).  Mood swings or irritability.  Memory problems.  Weight gain.  Hair growth on the face and chest.  Bladder infections or problems with urinating. How  is this diagnosed? This condition is diagnosed based on your medical history, a physical exam, your age, your menstrual history, and your symptoms. Hormone tests may also be done. How is this treated? In some cases, no treatment is needed. You and your health care provider should make a decision together about whether treatment is necessary. Treatment will be based on your individual condition and preferences. Treatment for this condition focuses on managing symptoms. Treatment may include:  Menopausal hormone therapy (MHT).  Medicines to treat specific symptoms or complications.  Acupuncture.  Vitamin or herbal supplements. Before starting treatment, make sure to let your health care provider know if you have a personal or family history of:  Heart disease.  Breast cancer.  Blood clots.  Diabetes.  Osteoporosis. Follow these instructions at home: Lifestyle  Do not use any products that contain nicotine or tobacco, such as cigarettes and e-cigarettes. If you need help quitting, ask your health care provider.  Get at least 30 minutes of physical activity on 5 or more days each week.  Avoid alcoholic and caffeinated beverages, as well as spicy foods. This may help prevent hot flashes.  Get 7-8 hours of sleep each night.  If you have hot flashes, try: ? Dressing in layers. ? Avoiding things that may trigger hot flashes, such as spicy food, warm places, or stress. ? Taking slow, deep breaths when a hot flash starts. ? Keeping a fan in your home and office.  Find ways to manage stress, such as deep breathing, meditation, or journaling.  Consider going to group therapy with other women who are having menopause symptoms. Ask your health care provider about recommended group therapy meetings. Eating and   drinking  Eat a healthy, balanced diet that contains whole grains, lean protein, low-fat dairy, and plenty of fruits and vegetables.  Your health care provider may recommend  adding more soy to your diet. Foods that contain soy include tofu, tempeh, and soy milk.  Eat plenty of foods that contain calcium and vitamin D for bone health. Items that are rich in calcium include low-fat milk, yogurt, beans, almonds, sardines, broccoli, and kale. Medicines  Take over-the-counter and prescription medicines only as told by your health care provider.  Talk with your health care provider before starting any herbal supplements. If prescribed, take vitamins and supplements as told by your health care provider. These may include: ? Calcium. Women age 51 and older should get 1,200 mg (milligrams) of calcium every day. ? Vitamin D. Women need 600-800 International Units of vitamin D each day. ? Vitamins B12 and B6. Aim for 50 micrograms of B12 and 1.5 mg of B6 each day. General instructions  Keep track of your menstrual periods, including: ? When they occur. ? How heavy they are and how long they last. ? How much time passes between periods.  Keep track of your symptoms, noting when they start, how often you have them, and how long they last.  Use vaginal lubricants or moisturizers to help with vaginal dryness and improve comfort during sex.  Keep all follow-up visits as told by your health care provider. This is important. This includes any group therapy or counseling. Contact a health care provider if:  You are still having menstrual periods after age 55.  You have pain during sex.  You have not had a period for 12 months and you develop vaginal bleeding. Get help right away if:  You have: ? Severe depression. ? Excessive vaginal bleeding. ? Pain when you urinate. ? A fast or irregular heart beat (palpitations). ? Severe headaches. ? Abdomen (abdominal) pain or severe indigestion.  You fell and you think you have a broken bone.  You develop leg or chest pain.  You develop vision problems.  You feel a lump in your breast. Summary  Menopause is the normal  time of life when menstrual periods stop completely. It is usually confirmed by 12 months without a menstrual period.  The transition to menopause (perimenopause) most often happens between the ages of 45 and 55.  Symptoms can be managed through medicines, lifestyle changes, and complementary therapies such as acupuncture.  Eat a balanced diet that is rich in nutrients to promote bone health and heart health and to manage symptoms during menopause. This information is not intended to replace advice given to you by your health care provider. Make sure you discuss any questions you have with your health care provider. Document Released: 06/09/2003 Document Revised: 03/01/2017 Document Reviewed: 04/21/2016 Elsevier Patient Education  2020 [employer]  

## 2019-02-09 LAB — CYTOLOGY - PAP
Adequacy: ABSENT
Comment: NEGATIVE
Diagnosis: NEGATIVE
High risk HPV: NEGATIVE

## 2019-03-04 DIAGNOSIS — M170 Bilateral primary osteoarthritis of knee: Secondary | ICD-10-CM | POA: Insufficient documentation

## 2019-03-04 DIAGNOSIS — Z6841 Body Mass Index (BMI) 40.0 and over, adult: Secondary | ICD-10-CM | POA: Diagnosis not present

## 2019-04-09 DIAGNOSIS — M170 Bilateral primary osteoarthritis of knee: Secondary | ICD-10-CM | POA: Diagnosis not present

## 2019-04-09 DIAGNOSIS — Z6841 Body Mass Index (BMI) 40.0 and over, adult: Secondary | ICD-10-CM | POA: Diagnosis not present

## 2019-12-23 ENCOUNTER — Ambulatory Visit: Payer: BC Managed Care – PPO | Admitting: Physician Assistant

## 2020-05-17 ENCOUNTER — Other Ambulatory Visit (HOSPITAL_COMMUNITY): Payer: Self-pay | Admitting: Family Medicine

## 2020-05-17 DIAGNOSIS — Z1231 Encounter for screening mammogram for malignant neoplasm of breast: Secondary | ICD-10-CM

## 2020-05-30 ENCOUNTER — Ambulatory Visit: Payer: Self-pay | Admitting: Physician Assistant

## 2020-06-02 ENCOUNTER — Ambulatory Visit: Payer: Self-pay | Admitting: Physician Assistant

## 2020-06-09 ENCOUNTER — Encounter: Payer: Self-pay | Admitting: Dietician

## 2020-06-09 ENCOUNTER — Other Ambulatory Visit: Payer: Self-pay

## 2020-06-09 ENCOUNTER — Encounter: Payer: 59 | Attending: Internal Medicine | Admitting: Dietician

## 2020-06-09 NOTE — Patient Instructions (Signed)
Every time you find yourself emotional eating, give yourself 10-15 seconds to ask yourself "Am I hungry?". This pause will help you to step away from your emotions and begin to recognize what triggers the desire to eat. As you begin recognizing your triggers more, you will be able to change the course of your stress earlier in the process.  Get yourself a calendar and give yourself a star for every single thing you do to get towards your goals. Write a little note about what you did differently. Bring this back to your follow up to review with the dietitian!  Consider joining [employer] if you feel you can fit it in your schedule. Remember that ANY physical activity counts!  Begin to recognize the food groups in your meals compared to the Balanced Plate. . What is your carb choice(s) for the meal? . What is your source of protein? Marland Kitchen Where are my non-starchy vegetables?

## 2020-06-09 NOTE — Progress Notes (Signed)
Medical Nutrition Therapy  Appointment Start time:  1400  Appointment End time:  1510  Primary concerns today: Weight Loss  Referral diagnosis: E66.01 Morbid Obesity Preferred learning style: No preference indicated Learning readiness: Contemplating   NUTRITION ASSESSMENT   Anthropometrics  Ht: 5'7" Wt:336.8 lbs Wt Change: 310 lbs (02/04/2019), 337 lbs (05/06/2020) Body mass index is 52.75 kg/m.   Clinical Medical Hx: HTN, OSA, GAD/Depression Medications: Wellbutrin Labs: N/A Notable Signs/Symptoms: N/A  Lifestyle & Dietary Hx Pt reports high levels of depression in the past year. Pt reports eating to solve depression, and that currently is hindering their weight loss. Pt works as a Tree surgeon at [employer], relatively low stress depending on the day. Pt reports having a fridge and microwave at work. Pt was previously taking weight loss supplements and was advised to discontinue them by their doctor. Pt reports having OSA and is using a Bi-Pap with success. Pt reports mother had a bout of cancer last year, but mother is now cancer free. Pt states this was a large source of stress and they still worry about their mother. Pt states their son is their rock, and pt wants to be the best version of themselves for their son. Pt states son's father has also struggled with weight. Pt reports having success with Weight Watchers in the past. Lost 20 pounds in a month and a half by changing what they cooked. Pt reports work schedule became too busy to continue and regained weight. Pt reports having a goal weight of 250 and then wanting to re-assess. Pt reports history of playing sports.   Estimated daily fluid intake: 128 oz Supplements: Daily multivitamin Sleep: 4-5 hours nightly, 6-7 when on Bi-Pap Stress / self-care: 5-6/10, worries about mothers health Current average weekly physical activity: ADLs, on their feet at work  24-Hr Dietary Recall First Meal: Banana Snack: none Second  Meal: Cheeseburger, fries, coke Snack: none Third Meal: Bowl of spaghetti, meat sauce, barbeque bread, bottle of water Snack: 4-5 gluten-free gummy worms Beverages: water, coke   NUTRITION DIAGNOSIS  NB-1.1 Food and nutrition-related knowledge deficit As related to Morbid Obesity.  As evidenced by BMI of 52.75 kg/m2, and self reported emotional eating of energy dense foods.   NUTRITION INTERVENTION  Nutrition education (E-1) on the following topics:  Educated patient on the balanced plate eating model. Recommended lunch and dinner be 1/2 non-starchy vegetables, 1/4 starches, and 1/4 protein. Recommended breakfast be a balance of starch and protein with a piece of fruit. Discussed with patient the importance of working towards hitting the proportions of the balanced plate consistently. Counseled patient on ways to begin recognizing each of the food groups from the balanced plate in their own meals, and how close they are to fitting the recommended proportions of the balanced plate. Educated patient on the nutritional value of each food group on the balanced plate model. Counseled patient on beginning to rebuild their trust in themselves to make the right food choices for their health. Educated patient on mindful eating, including listening to their body's hunger and satiety cues, as well as eating slowly and allowing meals to be more of a sensory experience. Counseled patient on allowing themselves to be present in their emotions when they consider emotional eating. Advised patient to evaluate whether the impulse to eat is hunger based, or emotionally driven.   Handouts Provided Include   Balanced Plate  Food list  Learning Style & Readiness for Change Teaching method utilized: Visual & Auditory  Demonstrated  degree of understanding via: Teach Back  Barriers to learning/adherence to lifestyle change: Stress/Emotions  Goals Established by Pt  Every time you find yourself emotional eating,  give yourself 10-15 seconds to ask yourself "Am I hungry?". This pause will help you to step away from your emotions and begin to recognize what triggers the desire to eat. As you begin recognizing your triggers more, you will be able to change the course of your stress earlier in the process.  Get yourself a calendar and give yourself a star for every single thing you do to get towards your goals. Write a little note about what you did differently. Bring this back to your follow up to review with the dietitian!  Consider joining [employer] if you feel you can fit it in your schedule. Remember that ANY physical activity counts!  Begin to recognize the food groups in your meals compared to the Balanced Plate.  What is your carb choice(s) for the meal?  What is your source of protein?  Where are my non-starchy vegetables?    MONITORING & EVALUATION Dietary intake, weekly physical activity, and emotional eating in 6 weeks.  Next Steps  Patient is to follow up with RDN.

## 2020-06-16 ENCOUNTER — Ambulatory Visit (INDEPENDENT_AMBULATORY_CARE_PROVIDER_SITE_OTHER): Payer: 59 | Admitting: Neurology

## 2020-06-16 ENCOUNTER — Encounter: Payer: Self-pay | Admitting: Neurology

## 2020-06-16 VITALS — BP 154/96 | HR 82 | Ht 67.0 in | Wt 335.0 lb

## 2020-06-16 DIAGNOSIS — G4733 Obstructive sleep apnea (adult) (pediatric): Secondary | ICD-10-CM | POA: Diagnosis not present

## 2020-06-16 DIAGNOSIS — R519 Headache, unspecified: Secondary | ICD-10-CM

## 2020-06-16 DIAGNOSIS — G4719 Other hypersomnia: Secondary | ICD-10-CM

## 2020-06-16 DIAGNOSIS — Z6841 Body Mass Index (BMI) 40.0 and over, adult: Secondary | ICD-10-CM

## 2020-06-16 DIAGNOSIS — R351 Nocturia: Secondary | ICD-10-CM | POA: Diagnosis not present

## 2020-06-16 DIAGNOSIS — R635 Abnormal weight gain: Secondary | ICD-10-CM

## 2020-06-16 NOTE — Progress Notes (Signed)
Subjective:    Patient ID: [name] is a 50 y.o. female.  HPI     Star Age, MD, PhD [facility] [address] P.O. [place], [place] [address]  Dear Aldona Bar,   I saw your patient, Christina Estrada, upon your kind request in my sleep clinic today for initial consultation sleep disorder, in particular, evaluation of her prior diagnosis of obstructive sleep apnea.  The patient is accompanied by her mother today.  As you know, Christina Estrada is a 50 year old right-handed woman with an underlying medical history of diabetes, depression, and severe obesity with a BMI of over 50, who was previously diagnosed with obstructive sleep apnea and placed on CPAP therapy.  I reviewed your office note from 05/06/2020.  I was able to review her sleep study report from 07/10/2013.  Study was interpreted by Dr. Phillips Odor.  She had a split-night sleep study.  Baseline AHI was 81/h, baseline oxygen saturation 96%, nadir was 61%.  She was titrated to an optimal CPAP pressure of 10 cm, at which time her AHI was 0.4/h.  She had a subsequent split-night sleep study on 11/13/2015, the study was done through [facility] health.her AHI was 86.1, O2 nadir 72%.  She has been on CPAP therapy but has not used her machine consistently she reports.  She indicates that she benefited from treatment and felt much better, her sleepiness which was much improved.  In the past year, according to the [employer], she has not used her machine except for 4 days.she reports increase in depression and weight gain.  She has met with a nutritionist recently and is working on weight loss.  She is on phentermine.  Her Epworth sleepiness score is 12 out of 24, fatigue severity score is 42 out of 63.    She is single and lives with her son.  She works as a Tree surgeon.  Her bedtime varies, she may be in bed around 11 or 11:30 PM.  Rise time is at 6:30 AM.  She lives with her 40 year old son.  She has been  sleepy at the wheel.  Her mom is concerned about her somnolence as she often falls asleep while talking.  Patient has woken up occasionally with a headache.  She has nocturia about 2-4 times per average night.  She drinks caffeine in the form of coffee, typically 1 cup in the morning.  She drinks alcohol occasionally.  She smokes rarely, 1 pack would last for 2 or 3 months.  She has received supplies when needed through [facility]. She is to see Dr. Merlene Laughter for her sleep apnea.  She has noticed significant leg swelling.  She has been on Lasix in the past but is currently not on it.  She has no family history of sleep apnea.  Her Past Medical History Is Significant For: Past Medical History:  Diagnosis Date  . Sleep apnea   . Vaginal Pap smear, abnormal     Her Past Surgical History Is Significant For: Past Surgical History:  Procedure Laterality Date  . CESAREAN SECTION    . CHOLECYSTECTOMY    . KNEE SURGERY     2017    Her Family History Is Significant For: Family History  Problem Relation Age of Onset  . Hypertension Father   . Diabetes Neg Hx     Her Social History Is Significant For: Social History   Socioeconomic History  . Marital status: Single    Spouse  name: Not on file  . Number of children: Not on file  . Years of education: Not on file  . Highest education level: Not on file  Occupational History  . Not on file  Tobacco Use  . Smoking status: Former Smoker    Packs/day: 0.25  . Smokeless tobacco: Never Used  . Tobacco comment: never used snuff or chewing tobacco.  Vaping Use  . Vaping Use: Never used  Substance and Sexual Activity  . Alcohol use: Yes    Comment: occasional   . Drug use: No  . Sexual activity: Not Currently    Birth control/protection: None  Other Topics Concern  . Not on file  Social History Narrative  . Not on file   Social Determinants of Health   Financial Resource Strain: Not on file  Food Insecurity: Not on file   Transportation Needs: Not on file  Physical Activity: Not on file  Stress: Not on file  Social Connections: Not on file    Her Allergies Are:  No Known Allergies:   Her Current Medications Are:  Outpatient Encounter Medications as of 06/16/2020  Medication Sig  . buPROPion (WELLBUTRIN) 75 MG tablet Take 75 mg by mouth 2 (two) times daily.  Marland Kitchen ibuprofen (ADVIL,MOTRIN) 200 MG tablet Take 600-800 mg by mouth every 6 (six) hours as needed for mild pain or moderate pain.  . [DISCONTINUED] estradiol (CLIMARA - DOSED IN MG/24 HR) 0.1 mg/24hr patch Place 0.1 mg onto the skin once a week. (Patient not taking: Reported on 06/16/2020)  . [DISCONTINUED] phentermine (ADIPEX-P) 37.5 MG tablet Take 37.5 mg by mouth once.  . [DISCONTINUED] progesterone (PROMETRIUM) 200 MG capsule    No facility-administered encounter medications on file as of 06/16/2020.  :  Review of Systems:  Out of a complete 14 point review of systems, all are reviewed and negative with the exception of these symptoms as listed below: Review of Systems  Neurological:       Here for sleep consult prior sleep study has CPAP but is not using. Pt motivated to start using again. Pt reports she has been struggling with depression and has been remodeling and stopped using her machine.  Epworth Sleepiness Scale 0= would never doze 1= slight chance of dozing 2= moderate chance of dozing 3= high chance of dozing  Sitting and reading:2 Watching TV:2 Sitting inactive in a public place (ex. Theater or meeting):1 As a passenger in a car for an hour without a break:3 Lying down to rest in the afternoon:1 Sitting and talking to someone:2 Sitting quietly after lunch (no alcohol):1 In a car, while stopped in traffic:0 Total:12     Objective:  Neurological Exam  Physical Exam Physical Examination:   Vitals:   06/16/20 1239  BP: (!) 154/96  Pulse: 82  SpO2: 95%    General Examination: The patient is a very pleasant 50 y.o.  female in no acute distress. She appears well-developed and well-nourished and well groomed.   HEENT: Normocephalic, atraumatic, pupils are equal, round and reactive to light, extraocular tracking is good without limitation to gaze excursion or nystagmus noted. Hearing is grossly intact. Face is symmetric with normal facial animation. Speech is clear with no dysarthria noted. There is no hypophonia. There is no lip, neck/head, jaw or voice tremor. Neck is supple with full range of passive and active motion. There are no carotid bruits on auscultation. Oropharynx exam reveals: mild mouth dryness, adequate dental hygiene and marked airway crowding, Mallampati class IV.  Tongue protrudes centrally and palate elevates symmetrically, neck circumference of 18 1/4 inches.    Chest: Clear to auscultation without wheezing, rhonchi or crackles noted.  Heart: S1+S2+0, regular and normal without murmurs, rubs or gallops noted.   Abdomen: Soft, non-tender and non-distended with normal bowel sounds appreciated on auscultation.  Extremities: There is 2+ pitting edema in the distal lower extremities bilaterally.   Skin: Warm and dry without trophic changes noted.   Musculoskeletal: exam reveals no obvious joint deformities, tenderness or joint swelling or erythema.   Neurologically:  Mental status: The patient is awake, alert and oriented in all 4 spheres. Her immediate and remote memory, attention, language skills and fund of knowledge are appropriate. There is no evidence of aphasia, agnosia, apraxia or anomia. Speech is clear with normal prosody and enunciation. Thought process is linear. Mood is normal and affect is normal.  Cranial nerves II - XII are as described above under HEENT exam.  Motor exam: Normal bulk, strength and tone is noted. There is no tremor, Romberg is negative. Fine motor skills and coordination: grossly intact.  Cerebellar testing: No dysmetria or intention tremor. There is no truncal  or gait ataxia.  Sensory exam: intact to light touch in the upper and lower extremities.  Gait, station and balance: She stands easily. No veering to one side is noted. No leaning to one side is noted. Posture is age-appropriate and stance is narrow based. Gait shows normal stride length and normal pace. No problems turning are noted.   Assessment and Plan:   In summary, Christina Estrada is a very pleasant 50 y.o.-year old female with an underlying medical history of diabetes, depression, and severe obesity with a BMI of over 50, who presents for evaluation of her obstructive sleep apnea.  She was diagnosed with severe obstructive sleep apnea in 2015 and had a recheck in 2017.  She has had a CPAP machine since 2017.  She has not used it consistently and in fact has not used it for the past at least 1 year.  She reports that she benefited from treatment and is willing to restart therapy.  She has struggled with depression and weight gained.  She is advised to follow-up closely with her providers in this regard.  She is advised to get back on her CPAP therapy consistently.  We can adjust the pressure if need be.  She is not quite eligible for a new machine.  She is up-to-date with her supplies through her DME company, [employer].  We talked about the diagnosis of OSA (obstructive sleep apnea), its prognosis and treatment options.  I explained in particular the risks and ramifications of untreated moderate to severe OSA, especially with respect to developing cardiovascular disease down the Road, including congestive heart failure, difficult to treat hypertension, cardiac arrhythmias, or stroke. Even type 2 diabetes has, in part, been linked to untreated OSA. Symptoms of untreated OSA include daytime sleepiness, memory problems, mood irritability and mood disorder such as depression and anxiety, lack of energy, as well as recurrent headaches, especially morning headaches. We talked about complete smoking  cessation and trying to maintain a healthy lifestyle in general, as well as the importance of weight control. We also talked about the importance of good sleep hygiene. I recommended the following at this time: sleep study.  I explained the importance of being compliant with PAP treatment, not only for insurance purposes but primarily to improve Her symptoms, and for the patient's long term health benefit,  including to reduce Her cardiovascular risks. I answered all their questions today and the patient and her mother were in agreement.  She is advised to follow-up in this clinic routinely to see one of our nurse practitioners in 3 months, we will review her symptoms and compliance data at the time. Thank you very much for allowing me to participate in the care of this nice patient. If I can be of any further assistance to you please do not hesitate to call me at [phone].  Sincerely,   Star Age, MD, PhD

## 2020-06-16 NOTE — Patient Instructions (Addendum)
It was nice to meet you today! Here is what we discussed today and what we came up with as our plan for you:.   Based on your symptoms and 2 previous sleep studies you have severe obstructive sleep apnea.  Unfortunately, you have stopped using your CPAP.   I think it is imperative that you start using her CPAP again. You are not quite eligible for a new machine.  Please continue using your CPAP regularly. While your insurance requires that you use CPAP at least 4 hours each night on 70% of the nights, I recommend, that you not skip any nights and use it throughout the night if you can. Getting used to CPAP and staying with the treatment long term does take time and patience and discipline. Untreated obstructive sleep apnea when it is moderate to severe can have an adverse impact on cardiovascular health and raise her risk for heart disease, arrhythmias, hypertension, congestive heart failure, stroke and diabetes. Untreated obstructive sleep apnea causes sleep disruption, nonrestorative sleep, and sleep deprivation. This can have an impact on your day to day functioning and cause daytime sleepiness and impairment of cognitive function, memory loss, mood disturbance, and problems focussing. Using CPAP regularly can improve these symptoms.  Please remember, the long-term risks and ramifications of untreated moderate to severe obstructive sleep apnea are: increased Cardiovascular disease, including congestive heart failure, stroke, difficult to control hypertension, treatment resistant obesity, arrhythmias, especially irregular heartbeat commonly known as A. Fib. (atrial fibrillation); even type 2 diabetes has been linked to untreated OSA.   Sleep apnea can cause disruption of sleep and sleep deprivation in most cases, which, in turn, can cause recurrent headaches, problems with memory, mood, concentration, focus, and vigilance. Most people with untreated sleep apnea report excessive daytime sleepiness, which  can affect their ability to drive. Please do not drive if you feel sleepy. Patients with sleep apnea can also develop difficulty initiating and maintaining sleep (aka insomnia).   Please note that untreated obstructive sleep apnea may carry additional perioperative morbidity. Patients with significant obstructive sleep apnea (typically, in the moderate to severe degree) should receive, if possible, perioperative PAP (positive airway pressure) therapy and the surgeons and particularly the anesthesiologists should be informed of the diagnosis and the severity of the sleep disordered breathing.   Please be completely compliant with your CPAP, meaning use it every night, all night.  You are up-to-date with your supplies through your DME company at [employer].  Please follow-up routinely in this clinic to see one of our nurse practitioners in 3 months, sooner if needed.

## 2020-07-22 ENCOUNTER — Ambulatory Visit: Payer: 59 | Admitting: Dietician

## 2020-08-18 ENCOUNTER — Encounter: Payer: Self-pay | Admitting: Physician Assistant

## 2020-08-18 ENCOUNTER — Other Ambulatory Visit: Payer: Self-pay

## 2020-08-18 ENCOUNTER — Ambulatory Visit (INDEPENDENT_AMBULATORY_CARE_PROVIDER_SITE_OTHER): Payer: 59 | Admitting: Physician Assistant

## 2020-08-18 DIAGNOSIS — D171 Benign lipomatous neoplasm of skin and subcutaneous tissue of trunk: Secondary | ICD-10-CM

## 2020-08-18 DIAGNOSIS — L304 Erythema intertrigo: Secondary | ICD-10-CM

## 2020-08-18 DIAGNOSIS — Z1283 Encounter for screening for malignant neoplasm of skin: Secondary | ICD-10-CM | POA: Diagnosis not present

## 2020-08-18 MED ORDER — ALCLOMETASONE DIPROPIONATE 0.05 % EX CREA
TOPICAL_CREAM | Freq: Two times a day (BID) | CUTANEOUS | 3 refills | Status: DC | PRN
Start: 2020-08-18 — End: 2022-03-31

## 2020-08-18 MED ORDER — KETOCONAZOLE 2 % EX CREA: 1.0000 "application " | TOPICAL_CREAM | Freq: Two times a day (BID) | CUTANEOUS | 10 refills | Status: AC

## 2020-08-18 NOTE — Progress Notes (Signed)
   Follow-Up Visit   Subjective  Christina Estrada is a 50 y.o. female who presents for the following: Annual Exam (No new concerns- No personal or family history of melanoma or non mole skin cancer.). Rash under her breasts. Increases with heat. Very irritated right now.   The following portions of the chart were reviewed this encounter and updated as appropriate:  Tobacco  Allergies  Meds  Problems  Med Hx  Surg Hx  Fam Hx      Objective  Well appearing patient in no apparent distress; mood and affect are within normal limits.  A full examination was performed including scalp, head, eyes, ears, nose, lips, neck, chest, axillae, abdomen, back, buttocks, bilateral upper extremities, bilateral lower extremities, hands, feet, fingers, toes, fingernails, and toenails. All findings within normal limits unless otherwise noted below.  Objective  head to toe: No atypical nevi No signs of non-mole skin cancer.   Objective  inframammary folds: Red and scaly with an advancing border  Objective  Right Upper Back: 10 cm soft mass-growing larger   Assessment & Plan  Encounter for screening for malignant neoplasm of skin head to toe  Yearly skin exam  Erythema intertrigo inframammary folds  Anaerobic and Aerobic Culture - inframammary folds-rule out MRSA  ketoconazole (NIZORAL) 2 % cream - inframammary folds  alclomethasone (ACLOVATE) 0.05 % cream - inframammary folds  Lipoma of torso Right Upper Back  General surgeon consult    I, Javel Hersh, PA-C, have reviewed all documentation's for this visit.  The documentation on 08/18/20 for the exam, diagnosis, procedures and orders are all accurate and complete.

## 2020-08-23 LAB — ANAEROBIC AND AEROBIC CULTURE
ANA ISOLATE 1:: NEGATIVE
MICRO NUMBER:: 11911115
MICRO NUMBER:: 11911116
SPECIMEN QUALITY:: ADEQUATE
SPECIMEN QUALITY:: ADEQUATE

## 2020-08-24 ENCOUNTER — Telehealth: Payer: Self-pay

## 2020-08-24 NOTE — Telephone Encounter (Signed)
-----   Message from Warren Danes, [place] sent at 08/23/2020  6:30 PM EDT ----- Check status

## 2020-08-24 NOTE — Telephone Encounter (Signed)
[place] office per [place] check status

## 2020-08-25 ENCOUNTER — Ambulatory Visit: Payer: 59 | Admitting: Dietician

## 2020-08-26 ENCOUNTER — Ambulatory Visit: Payer: 59 | Admitting: Dietician

## 2020-09-05 ENCOUNTER — Telehealth: Payer: Self-pay | Admitting: Neurology

## 2020-09-05 DIAGNOSIS — G4733 Obstructive sleep apnea (adult) (pediatric): Secondary | ICD-10-CM

## 2020-09-05 NOTE — Telephone Encounter (Signed)
Pt is needing a new hose for her cpap machine and [facility] has one but they informed her that she is needing a prescription faxed over to them in order to get the hose. Please fax it to [phone]

## 2020-09-05 NOTE — Telephone Encounter (Signed)
I called pt and advised I have sent order to [facility]. Pt verbalized understanding and will call back if she has any issues picking the rx up.

## 2020-09-20 ENCOUNTER — Other Ambulatory Visit (HOSPITAL_COMMUNITY): Payer: Self-pay | Admitting: Family Medicine

## 2020-09-20 DIAGNOSIS — N63 Unspecified lump in unspecified breast: Secondary | ICD-10-CM

## 2020-09-22 ENCOUNTER — Ambulatory Visit: Payer: 59 | Admitting: Family Medicine

## 2020-09-27 NOTE — Progress Notes (Deleted)
PATIENT: Christina Estrada DOB: [birth year]  REASON FOR VISIT: follow up HISTORY FROM: patient  No chief complaint on file.    HISTORY OF PRESENT ILLNESS: 09/27/20 ALL:  Christina Estrada is a 50 y.o. female here today for follow up for OSA on CPAP.  She was seen in consult by Dr Rexene Alberts 06/16/2020. Sleep study reviewed from Dr Merlene Laughter from 07/2013 that showed severe OSA with total AHI of 81/hr and O2 nadir of 72%. Split night study with [facility] 11/13/2015 confirmed severe OSA. She was advised to resume CPAP and new supply orders were sent.      HISTORY: (copied from Dr Guadelupe Sabin previous note)  Dear Christina Estrada,   I saw your patient, Christina Estrada, upon your kind request in my sleep clinic today for initial consultation sleep disorder, in particular, evaluation of her prior diagnosis of obstructive sleep apnea.  The patient is accompanied by her mother today.  As you know, Ms. Willbanks is a 50 year old right-handed woman with an underlying medical history of diabetes, depression, and severe obesity with a BMI of over 50, who was previously diagnosed with obstructive sleep apnea and placed on CPAP therapy.  I reviewed your office note from 05/06/2020.  I was able to review her sleep study report from 07/10/2013.  Study was interpreted by Dr. Phillips Odor.  She had a split-night sleep study.  Baseline AHI was 81/h, baseline oxygen saturation 96%, nadir was 61%.  She was titrated to an optimal CPAP pressure of 10 cm, at which time her AHI was 0.4/h.  She had a subsequent split-night sleep study on 11/13/2015, the study was done through [facility] health.her AHI was 86.1, O2 nadir 72%.  She has been on CPAP therapy but has not used her machine consistently she reports.  She indicates that she benefited from treatment and felt much better, her sleepiness which was much improved.  In the past year, according to the [employer], she has not used her machine except for 4 days.she reports increase in depression  and weight gain.  She has met with a nutritionist recently and is working on weight loss.  She is on phentermine.  Her Epworth sleepiness score is 12 out of 24, fatigue severity score is 42 out of 63.     She is single and lives with her son.  She works as a Tree surgeon.  Her bedtime varies, she may be in bed around 11 or 11:30 PM.  Rise time is at 6:30 AM.  She lives with her 26 year old son.  She has been sleepy at the wheel.  Her mom is concerned about her somnolence as she often falls asleep while talking.  Patient has woken up occasionally with a headache.  She has nocturia about 2-4 times per average night.  She drinks caffeine in the form of coffee, typically 1 cup in the morning.  She drinks alcohol occasionally.  She smokes rarely, 1 pack would last for 2 or 3 months.  She has received supplies when needed through [facility]. She is to see Dr. Merlene Laughter for her sleep apnea.   She has noticed significant leg swelling.  She has been on Lasix in the past but is currently not on it.  She has no family history of sleep apnea.   REVIEW OF SYSTEMS: Out of a complete 14 system review of symptoms, the patient complains only of the following symptoms, and all other reviewed systems are negative.  ESS: FSS:  ALLERGIES: No  Known Allergies  HOME MEDICATIONS: Outpatient Medications Prior to Visit  Medication Sig Dispense Refill   alclomethasone (ACLOVATE) 0.05 % cream Apply topically 2 (two) times daily as needed (Rash). 180 g 3   buPROPion (WELLBUTRIN) 75 MG tablet Take 75 mg by mouth 2 (two) times daily.     ibuprofen (ADVIL,MOTRIN) 200 MG tablet Take 600-800 mg by mouth every 6 (six) hours as needed for mild pain or moderate pain.     ketoconazole (NIZORAL) 2 % cream Apply 1 application topically in the morning and at bedtime. 15 g 10   metFORMIN (GLUCOPHAGE) 500 MG tablet Take by mouth 2 (two) times daily with a meal.     No facility-administered medications prior to visit.    PAST  MEDICAL HISTORY: Past Medical History:  Diagnosis Date   Sleep apnea    Vaginal Pap smear, abnormal     PAST SURGICAL HISTORY: Past Surgical History:  Procedure Laterality Date   CESAREAN SECTION     CHOLECYSTECTOMY     KNEE SURGERY     2017    FAMILY HISTORY: Family History  Problem Relation Age of Onset   Hypertension Father    Diabetes Neg Hx     SOCIAL HISTORY: Social History   Socioeconomic History   Marital status: Single    Spouse name: Not on file   Number of children: Not on file   Years of education: Not on file   Highest education level: Not on file  Occupational History   Not on file  Tobacco Use   Smoking status: Former    Packs/day: 0.25    Pack years: 0.00    Types: Cigarettes   Smokeless tobacco: Never   Tobacco comments:    never used snuff or chewing tobacco.  Vaping Use   Vaping Use: Never used  Substance and Sexual Activity   Alcohol use: Yes    Comment: occasional    Drug use: No   Sexual activity: Not Currently    Birth control/protection: None  Other Topics Concern   Not on file  Social History Narrative   Not on file   Social Determinants of Health   Financial Resource Strain: Not on file  Food Insecurity: Not on file  Transportation Needs: Not on file  Physical Activity: Not on file  Stress: Not on file  Social Connections: Not on file  Intimate Partner Violence: Not on file     PHYSICAL EXAM  There were no vitals filed for this visit. There is no height or weight on file to calculate BMI.  Generalized: Well developed, in no acute distress  Cardiology: normal rate and rhythm, no murmur noted Respiratory: clear to auscultation bilaterally  Neurological examination  Mentation: Alert oriented to time, place, history taking. Follows all commands speech and language fluent Cranial nerve II-XII: Pupils were equal round reactive to light. Extraocular movements were full, visual field were full  Motor: The motor testing  reveals 5 over 5 strength of all 4 extremities. Good symmetric motor tone is noted throughout.  Gait and station: Gait is normal.    DIAGNOSTIC DATA (LABS, IMAGING, TESTING) - I reviewed patient records, labs, notes, testing and imaging myself where available.  No flowsheet data found.   Lab Results  Component Value Date   WBC 12.1 (H) 09/08/2014   HGB 14.6 12/13/2017   HCT 43.0 12/13/2017   MCV 87.0 09/08/2014   PLT 235 09/08/2014      Component Value Date/Time   NA  140 12/13/2017 1235   K 3.7 12/13/2017 1235   CL 103 12/13/2017 1235   CO2 26 09/08/2014 1730   GLUCOSE 97 12/13/2017 1235   BUN 9 12/13/2017 1235   CREATININE 0.70 12/13/2017 1235   CALCIUM 8.6 (L) 09/08/2014 1730   GFRNONAA >60 09/08/2014 1730   GFRAA >60 09/08/2014 1730   No results found for: CHOL, HDL, LDLCALC, LDLDIRECT, TRIG, CHOLHDL No results found for: HGBA1C No results found for: VITAMINB12 No results found for: TSH   ASSESSMENT AND PLAN 50 y.o. year old female  has a past medical history of Sleep apnea and Vaginal Pap smear, abnormal. here with   No diagnosis found.    ASHLEA DUSING is doing well on CPAP therapy. Compliance report reveals sub optimal usage. She was encouraged to continue using CPAP nightly and for greater than 4 hours each night. We will update supply orders as indicated. Risks of untreated sleep apnea review and education materials provided. Healthy lifestyle habits encouraged. She will follow up in ***, sooner if needed. She verbalizes understanding and agreement with this plan.    No orders of the defined types were placed in this encounter.    No orders of the defined types were placed in this encounter.     Debbora Presto, FNP-C 09/27/2020, 3:25 PM [facility] [address] [place], [place] [address] [phone]

## 2020-09-28 ENCOUNTER — Telehealth: Payer: Self-pay

## 2020-09-28 NOTE — Telephone Encounter (Signed)
LVM to bring CPAP and power cord to appt, have no data past 4/15. Will have to do a manual download.

## 2020-09-29 ENCOUNTER — Ambulatory Visit: Payer: 59 | Admitting: Family Medicine

## 2020-10-20 ENCOUNTER — Ambulatory Visit: Payer: 59 | Admitting: Dietician

## 2020-10-25 ENCOUNTER — Encounter (HOSPITAL_COMMUNITY): Payer: 59

## 2020-10-25 ENCOUNTER — Encounter (HOSPITAL_COMMUNITY): Payer: Self-pay

## 2020-10-25 ENCOUNTER — Ambulatory Visit (HOSPITAL_COMMUNITY): Admission: RE | Admit: 2020-10-25 | Payer: 59 | Source: Ambulatory Visit

## 2020-11-17 ENCOUNTER — Ambulatory Visit: Payer: 59 | Admitting: Family Medicine

## 2020-12-01 ENCOUNTER — Ambulatory Visit: Payer: 59 | Admitting: Dietician

## 2020-12-27 ENCOUNTER — Ambulatory Visit (HOSPITAL_COMMUNITY): Payer: 59

## 2020-12-27 ENCOUNTER — Encounter (HOSPITAL_COMMUNITY): Payer: 59

## 2021-02-09 ENCOUNTER — Ambulatory Visit: Payer: 59 | Admitting: Dietician

## 2021-03-09 ENCOUNTER — Ambulatory Visit: Payer: 59 | Admitting: Family Medicine

## 2021-07-20 ENCOUNTER — Ambulatory Visit: Payer: 59 | Admitting: Family Medicine

## 2021-08-20 ENCOUNTER — Emergency Department (HOSPITAL_COMMUNITY): Payer: 59

## 2021-08-20 ENCOUNTER — Other Ambulatory Visit: Payer: Self-pay

## 2021-08-20 ENCOUNTER — Encounter (HOSPITAL_COMMUNITY): Payer: Self-pay

## 2021-08-20 ENCOUNTER — Emergency Department (HOSPITAL_COMMUNITY)
Admission: EM | Admit: 2021-08-20 | Discharge: 2021-08-20 | Disposition: A | Discharge status: Home / Self Care | Payer: 59 | Attending: Emergency Medicine | Admitting: Emergency Medicine

## 2021-08-20 DIAGNOSIS — I7090 Unspecified atherosclerosis: Secondary | ICD-10-CM | POA: Insufficient documentation

## 2021-08-20 DIAGNOSIS — R1030 Lower abdominal pain, unspecified: Secondary | ICD-10-CM | POA: Diagnosis present

## 2021-08-20 DIAGNOSIS — E1165 Type 2 diabetes mellitus with hyperglycemia: Secondary | ICD-10-CM | POA: Diagnosis not present

## 2021-08-20 DIAGNOSIS — N83201 Unspecified ovarian cyst, right side: Secondary | ICD-10-CM | POA: Insufficient documentation

## 2021-08-20 DIAGNOSIS — K760 Fatty (change of) liver, not elsewhere classified: Secondary | ICD-10-CM | POA: Diagnosis not present

## 2021-08-20 DIAGNOSIS — Z7984 Long term (current) use of oral hypoglycemic drugs: Secondary | ICD-10-CM | POA: Insufficient documentation

## 2021-08-20 HISTORY — DX: Type 2 diabetes mellitus without complications: E11.9

## 2021-08-20 LAB — CBC WITH DIFFERENTIAL/PLATELET
Abs Immature Granulocytes: 0.04 10*3/uL (ref 0.00–0.07)
Basophils Absolute: 0 10*3/uL (ref 0.0–0.1)
Basophils Relative: 1 %
Eosinophils Absolute: 0.1 10*3/uL (ref 0.0–0.5)
Eosinophils Relative: 1 %
HCT: 45.5 % (ref 36.0–46.0)
Hemoglobin: 14.7 g/dL (ref 12.0–15.0)
Immature Granulocytes: 1 %
Lymphocytes Relative: 18 %
Lymphs Abs: 1.5 10*3/uL (ref 0.7–4.0)
MCH: 28.2 pg (ref 26.0–34.0)
MCHC: 32.3 g/dL (ref 30.0–36.0)
MCV: 87.2 fL (ref 80.0–100.0)
Monocytes Absolute: 0.5 10*3/uL (ref 0.1–1.0)
Monocytes Relative: 6 %
Neutro Abs: 6.1 10*3/uL (ref 1.7–7.7)
Neutrophils Relative %: 73 %
Platelets: 244 10*3/uL (ref 150–400)
RBC: 5.22 MIL/uL — ABNORMAL HIGH (ref 3.87–5.11)
RDW: 13.3 % (ref 11.5–15.5)
WBC: 8.2 10*3/uL (ref 4.0–10.5)
nRBC: 0 % (ref 0.0–0.2)

## 2021-08-20 LAB — URINALYSIS, ROUTINE W REFLEX MICROSCOPIC
Bacteria, UA: NONE SEEN
Bilirubin Urine: NEGATIVE
Glucose, UA: >=500 mg/dL — AB
Hgb urine dipstick: NEGATIVE
Ketones, ur: 20 mg/dL — AB
Nitrite: NEGATIVE
Protein, ur: NEGATIVE mg/dL
Specific Gravity, Urine: >1.046 — ABNORMAL HIGH (ref 1.005–1.030)
WBC, UA: >50 WBC/hpf — ABNORMAL HIGH (ref 0–5)
pH: 5 (ref 5.0–8.0)

## 2021-08-20 LAB — COMPREHENSIVE METABOLIC PANEL
ALT: 32 U/L (ref 0–44)
AST: 23 U/L (ref 15–41)
Albumin: 3.7 g/dL (ref 3.5–5.0)
Alkaline Phosphatase: 92 U/L (ref 38–126)
Anion gap: 8 (ref 5–15)
BUN: 12 mg/dL (ref 6–20)
CO2: 26 mmol/L (ref 22–32)
Calcium: 8.7 mg/dL — ABNORMAL LOW (ref 8.9–10.3)
Chloride: 106 mmol/L (ref 98–111)
Creatinine, Ser: 0.63 mg/dL (ref 0.44–1.00)
GFR, Estimated: >60 mL/min (ref >60)
Glucose, Bld: 247 mg/dL — ABNORMAL HIGH (ref 70–99)
Potassium: 4.2 mmol/L (ref 3.5–5.1)
Sodium: 140 mmol/L (ref 135–145)
Total Bilirubin: 0.7 mg/dL (ref 0.3–1.2)
Total Protein: 6.9 g/dL (ref 6.5–8.1)

## 2021-08-20 LAB — POC URINE PREG, ED: Preg Test, Ur: NEGATIVE

## 2021-08-20 LAB — LIPASE, BLOOD: Lipase: 33 U/L (ref 11–51)

## 2021-08-20 MED ORDER — CEPHALEXIN 500 MG PO CAPS
500.0000 mg | ORAL_CAPSULE | Freq: Once | ORAL | Status: AC
  Administered 2021-08-20: 500 mg via ORAL
  Filled 2021-08-20: qty 1

## 2021-08-20 MED ORDER — HYDROMORPHONE HCL 1 MG/ML IJ SOLN
1.0000 mg | Freq: Once | INTRAMUSCULAR | Status: AC
  Administered 2021-08-20: 1 mg via INTRAVENOUS
  Filled 2021-08-20: qty 1

## 2021-08-20 MED ORDER — IOHEXOL 9 MG/ML PO SOLN
ORAL | Status: DC
Start: 2021-08-20 — End: 2021-08-20
  Filled 2021-08-20: qty 500

## 2021-08-20 MED ORDER — IOHEXOL 9 MG/ML PO SOLN: 500.0000 mL | ORAL | Status: AC

## 2021-08-20 MED ORDER — FENTANYL CITRATE PF 50 MCG/ML IJ SOSY
50.0000 ug | PREFILLED_SYRINGE | Freq: Once | INTRAMUSCULAR | Status: AC
  Administered 2021-08-20: 50 ug via INTRAVENOUS
  Filled 2021-08-20: qty 1

## 2021-08-20 MED ORDER — SODIUM CHLORIDE 0.9 % IV BOLUS
1000.0000 mL | Freq: Once | INTRAVENOUS | Status: AC
  Administered 2021-08-20: 1000 mL via INTRAVENOUS

## 2021-08-20 MED ORDER — IOHEXOL 300 MG/ML  SOLN
100.0000 mL | Freq: Once | INTRAMUSCULAR | Status: AC | PRN
  Administered 2021-08-20: 100 mL via INTRAVENOUS

## 2021-08-20 MED ORDER — CEPHALEXIN 500 MG PO CAPS: 500.0000 mg | ORAL_CAPSULE | Freq: Two times a day (BID) | ORAL | 0 refills | Status: DC

## 2021-08-20 NOTE — ED Provider Notes (Signed)
[facility] EMERGENCY DEPARTMENT Provider Note   CSN: [id number] Arrival date & time: 08/20/21  2831     History  Chief Complaint  Patient presents with   Abdominal Pain    Christina Estrada is a 51 y.o. female pertinent history of obesity, type 2 diabetes, cholecystectomy.  Patient presents to the ER today for lower abdominal pain onset this morning around 8 AM patient was watching TV when she had sudden onset of pain it was sharp constant severe worsened with movement and palpation no alleviating factors pain is somewhat improved after EMS arrival but worsened again after ER arrival, she denies similar pain in the past.  She reports that she has been experiencing nausea over the past 2 hours as well as feeling warm but has not measured a fever.  Patient denies similar symptoms in the past.  She reports last bowel movement was yesterday and normal, denies history of diarrhea, denies vomiting, dysuria/hematuria, vaginal bleeding/discharge or any additional concerns. HPI     Home Medications Prior to Admission medications   Medication Sig Start Date End Date Taking? Authorizing Provider  cephALEXin (KEFLEX) 500 MG capsule Take 1 capsule (500 mg total) by mouth 2 (two) times daily for 7 days. 08/20/21 08/27/21 Yes Deliah Boston, PA-C  alclomethasone (ACLOVATE) 0.05 % cream Apply topically 2 (two) times daily as needed (Rash). 08/18/20   Sheffield, Ronalee Red, PA-C  buPROPion (WELLBUTRIN) 75 MG tablet Take 75 mg by mouth 2 (two) times daily.    [provider]  ibuprofen (ADVIL,MOTRIN) 200 MG tablet Take 600-800 mg by mouth every 6 (six) hours as needed for mild pain or moderate pain.    [provider]  metFORMIN (GLUCOPHAGE) 500 MG tablet Take by mouth 2 (two) times daily with a meal.    [provider]      Allergies    Patient has no known allergies.    Review of Systems   Review of Systems Ten systems are reviewed and are negative for acute change except  as noted in the HPI  Physical Exam Updated Vital Signs BP 129/81   Pulse 99   Temp (!) 97.4 F (36.3 C) (Oral)   Resp 19   Ht '5\' 7"'$  (1.702 m)   Wt (!) 141.5 kg   LMP 08/13/2021   SpO2 96%   BMI 48.87 kg/m  Physical Exam Constitutional:      General: She is not in acute distress.    Appearance: Normal appearance. She is well-developed. She is not ill-appearing or diaphoretic.  HENT:     Head: Normocephalic and atraumatic.  Eyes:     General: Vision grossly intact. Gaze aligned appropriately.     Pupils: Pupils are equal, round, and reactive to light.  Neck:     Trachea: Trachea and phonation normal.  Pulmonary:     Effort: Pulmonary effort is normal. No respiratory distress.  Abdominal:     General: There is no distension.     Palpations: Abdomen is soft.     Tenderness: There is abdominal tenderness in the right lower quadrant, suprapubic area and left lower quadrant. There is no guarding or rebound. Positive signs include McBurney's sign.  Musculoskeletal:        General: Normal range of motion.     Cervical back: Normal range of motion.  Skin:    General: Skin is warm and dry.  Neurological:     Mental Status: She is alert.     GCS: GCS  eye subscore is 4. GCS verbal subscore is 5. GCS motor subscore is 6.     Comments: Speech is clear and goal oriented, follows commands Major Cranial nerves without deficit, no facial droop Moves extremities without ataxia, coordination intact  Psychiatric:        Behavior: Behavior normal.    ED Results / Procedures / Treatments   Labs (all labs ordered are listed, but only abnormal results are displayed) Labs Reviewed  CBC WITH DIFFERENTIAL/PLATELET - Abnormal; Notable for the following components:      Result Value   RBC 5.22 (*)    All other components within normal limits  COMPREHENSIVE METABOLIC PANEL - Abnormal; Notable for the following components:   Glucose, Bld 247 (*)    Calcium 8.7 (*)    All other components  within normal limits  URINALYSIS, ROUTINE W REFLEX MICROSCOPIC - Abnormal; Notable for the following components:   Color, Urine STRAW (*)    APPearance HAZY (*)    Specific Gravity, Urine >1.046 (*)    Glucose, UA >=500 (*)    Ketones, ur 20 (*)    Leukocytes,Ua SMALL (*)    WBC, UA >50 (*)    All other components within normal limits  POC URINE PREG, ED - Normal  URINE CULTURE  LIPASE, BLOOD    EKG None  Radiology CT ABDOMEN PELVIS WO CONTRAST  Result Date: 08/20/2021 CLINICAL DATA:  Right lower quadrant pain. Question mild pneumoperitoneum or pneumatosis on recent exam. EXAM: CT ABDOMEN AND PELVIS WITHOUT CONTRAST TECHNIQUE: Multidetector CT imaging of the abdomen and pelvis was performed following the standard protocol without IV contrast. RADIATION DOSE REDUCTION: This exam was performed according to the departmental dose-optimization program which includes automated exposure control, adjustment of the mA and/or kV according to patient size and/or use of iterative reconstruction technique. COMPARISON:  Contrast enhanced CT on 08/20/2021 FINDINGS: Lower chest: No acute findings. Hepatobiliary: Residual contrast enhancement is noted from recent contrast enhanced CT. No hepatic masses are identified. Mild-to-moderate diffuse hepatic steatosis is again noted. Prior cholecystectomy. No evidence of biliary obstruction. Pancreas: No mass or inflammatory process visualized on this unenhanced exam. Spleen:  Within normal limits in size. Adrenals/Urinary tract: Residual contrast is seen within the renal collecting systems and lower urinary tract. No evidence of mass or hydronephrosis. Bladder is unremarkable in appearance. Stomach/Bowel: No evidence of obstruction, inflammatory process, or abnormal fluid collections. No evidence of appendicitis or pneumoperitoneum on this exam. No evidence of oral contrast leak or extravasation. Vascular/Lymphatic: No pathologically enlarged lymph nodes identified. No  evidence of abdominal aortic aneurysm. Reproductive: Normal appearance of uterus. Simple appearing right ovarian cyst is again seen measuring 3.2 x 2.4 cm. No other pelvic mass identified. No evidence of inflammatory process or abnormal fluid collections. Other:  None. Musculoskeletal:  No suspicious bone lesions identified. IMPRESSION: No evidence of appendicitis, pneumoperitoneum, or other acute findings. 3.2 cm benign-appearing right ovarian cyst. Recommend follow-up US in 6-12 months. Note: This recommendation does not apply to premenarchal patients and to those with increased risk (genetic, family history, elevated tumor markers or other high-risk factors) of ovarian cancer. Reference: JACR 2020 Feb; 17(2):248-254 Hepatic steatosis. Electronically Signed   By: Marlaine Hind M.D.   On: 08/20/2021 16:32   CT Abdomen Pelvis W Contrast  Addendum Date: 08/20/2021   ADDENDUM REPORT: 08/20/2021 13:44 ADDENDUM: IMPRESSION should include Prominent CBD status post cholecystectomy, without intrahepatic or pancreatic ductal dilatation and no definite obstructing cause. Consider further evaluation with MRCP/ERCP. Electronically  Signed   By: Margarette Canada M.D.   On: 08/20/2021 13:44   Result Date: 08/20/2021 CLINICAL DATA:  51 year old female with RIGHT abdominal and pelvic pain with nausea. Patient does not have a reported history of appendectomy. EXAM: CT ABDOMEN AND PELVIS WITH CONTRAST TECHNIQUE: Multidetector CT imaging of the abdomen and pelvis was performed using the standard protocol following bolus administration of intravenous contrast. RADIATION DOSE REDUCTION: This exam was performed according to the departmental dose-optimization program which includes automated exposure control, adjustment of the mA and/or kV according to patient size and/or use of iterative reconstruction technique. CONTRAST:  159m OMNIPAQUE IOHEXOL 300 MG/ML  SOLN COMPARISON:  None Available. FINDINGS: Lower chest: No acute abnormality.  Hepatobiliary: Hepatic steatosis is identified without suspicious focal hepatic abnormality. The patient is status post cholecystectomy. The CBD is dilated to the ampulla measuring up to 14 mm, without obstructing cause or intrahepatic or pancreatic biliary dilatation. Pancreas: Unremarkable Spleen: UPPER limits normal spleen size noted. Adrenals/Urinary Tract: The kidneys, adrenal glands and bladder are unremarkable. Stomach/Bowel: The appendix is not definitely identified. There is a focus of gas adjacent to the cecal base (image 70: Series 2) which could represent a small amount of gas in a normal appendix (which is not well-defined) versus a tiny amount of pneumoperitoneum . There is a small amount of nonspecific fluid adjacent to the cecal base but without significant inflammatory changes. There is no evidence of wall thickening of adjacent bowel/cecum in this area There is no evidence of bowel obstruction or discrete abscess. Vascular/Lymphatic: Aortic atherosclerosis. No enlarged abdominal or pelvic lymph nodes. Reproductive: A 2.5 x 3.1 cm RIGHT adnexal/ovarian cyst is present. A trace amount of free pelvic fluid is noted. The uterus and LEFT adnexal regions are unremarkable. Other: A small umbilical hernia containing fat is noted. Musculoskeletal: No acute or suspicious bony abnormalities are noted. Mild degenerative disc disease in the lumbar spine identified. IMPRESSION: 1. The appendix is not definitely identified but there is a focus of gas adjacent to the cecal base which could represent a small amount of gas in a normal appendix versus a tiny amount of pneumoperitoneum. Small amount of nonspecific fluid adjacent to the cecal base is well but without significant inflammatory changes or cecal wall thickening. No evidence of bowel obstruction or discrete abscess. Repeat delayed CT scan with oral contrast may be helpful for further evaluation of this area. 2. 2.5 x 3.1 cm RIGHT adnexal/ovarian cyst.  Recommend follow-up [place] 6-12 months. Note: This recommendation does not apply to premenarchal patients and to those with increased risk (genetic, family history, elevated tumor markers or other high-risk factors) of ovarian cancer. Reference: JACR 2020 Feb; 17(2):248-254 3. Hepatic steatosis. 4. Aortic Atherosclerosis (ICD10-I70.0). Critical Value/emergent results were called by telephone at the time of interpretation on 08/20/2021 at 11:36 am to provider [facility], who verbally acknowledged these results. Electronically Signed: By: JMargarette CanadaM.D. On: 08/20/2021 11:36    Procedures Procedures    Medications Ordered in ED Medications  iohexol (OMNIPAQUE) 9 MG/ML oral solution 500 mL (has no administration in time range)  sodium chloride 0.9 % bolus 1,000 mL (0 mLs Intravenous Stopped 08/20/21 1213)  fentaNYL (SUBLIMAZE) injection 50 mcg (50 mcg Intravenous Given 08/20/21 1048)  iohexol (OMNIPAQUE) 300 MG/ML solution 100 mL (100 mLs Intravenous Contrast Given 08/20/21 1056)  HYDROmorphone (DILAUDID) injection 1 mg (1 mg Intravenous Given 08/20/21 1220)  cephALEXin (KEFLEX) capsule 500 mg (500 mg Oral Given 08/20/21 1716)  ED Course/ Medical Decision Making/ A&P Clinical Course as of 08/20/21 1736  Sun Aug 20, 2021  1046 Comprehensive metabolic panel(!) Hyperglycemia consistent with known history of diabetes.  Bicarb and anion gap normal.  Does not appear consistent with DKA.  No emergent electrolyte derangements noted.  No AKI, LFT elevations or gap. [BM]  1047 CBC with Differential(!) No leukocytosis to suggest bacterial infection, no anemia or thrombocytopenia. [BM]  1047 Lipase, blood Lipase within normal limits.  Low suspicion for pancreatitis [BM]  1712 Urinalysis, Routine w reflex microscopic Urine, Clean Catch(!) 50 WBCs, nitrate negative, no bacteria seen.  Suspect patient may be urinary experiencing a UTI as cause of her lower abdominal pain.  Will send for culture.  Discussed  case w/ Dr. Rogene Houston, will start patient on treatment for UTI given history of diabetes as well.  Patient started on Keflex 500 mg twice daily x7 days, she is encouraged to follow-up with her primary care provider over this week for recheck.   [BM]    Clinical Course User Index [BM] Gari Crown                           Medical Decision Making 51 year old female presented for sudden onset lower abdominal pain that began this morning at 830 while sitting down.  Patient denied urinary symptoms or vomiting/diarrhea.  Some associated nausea.  Patient was tender to the lower abdomen with a past McBurney's point sign.  Abdominal pain labs were ordered, risk versus benefits of CT scan were discussed with patient.  Patient wished to proceed with CT abdomen pelvis for rule out of appendicitis and further evaluation of cause of her pain. -------- CT abdomen pelvis showed possible tiny mount of pneumoperitoneum near the cecal base, appendix was not identified.  Radiologist recommended repeat CT scan with oral contrast as well in a few hours for reevaluation.  Additionally ovarian cyst was noted that they recommended 60-monthfollow-up along with incidental atherosclerosis and hepatic steatosis. --------- I then consulted general surgeon Dr. BConstance Hawwho advised follow-up CT imaging, no indication for take patient to the OR at this time.  Patient was seen and evaluated by Dr. PAlvino Chapelwho agreed with repeat CT scan. --------- Patient consented for a second CT abdomen pelvis.  There is no evidence of appendicitis, pneumoperitoneum or other acute findings per radiologist.  Again the 3.2 cm right ovarian cyst was identified.  In the meantime patient's urinalysis resulted showing 50 WBCs, possible patient experiencing UTI as cause of suprapubic lower abdominal pain.  On further history taking patient does endorse some increased urinary frequency over the last few days.  Amount and/or Complexity of Data  Reviewed Labs: ordered. Decision-making details documented in ED Course. Radiology: ordered.  Risk OTC drugs. Prescription drug management. Risk Details: I reevaluated patient at time of discharge she is well-appearing no acute distress sitting up in the chair, she reports she is feeling well, we discussed findings above and patient stated understanding she had no additional complaints or concerns.  Last dose of pain medication was around 5 hours ago.  Patient has a nontender abdomen on reevaluation.  No guarding or peritoneal signs.  Patient is agreeable to oral outpatient treatment for suspected UTI with Keflex 500 mg twice daily, first dose given in ER today.  Patient will follow closely with her primary care provider Dr. GHilma Favorsfor recheck this week.  We discussed maintaining oral hydration to avoid dehydration.  I discussed  strict return precautions with the patient today.  Additionally patient was informed of incidental findings on CT scan today including ovarian cyst and to have this brought up by PCP/OB/GYN.  Finally on the first CT scan today patient had questionable widening of the CBD, she has normal LFTs and a low suspicion for acute obstruction at this time, second CT scan did not see any obstruction or mention abnormalities there, discussed with attending physician Dr. Rogene Houston, plan to have patient follow-up outpatient with GI Dr. Laural Golden for recheck.     At this time there does not appear to be any evidence of an acute emergency medical condition and the patient appears stable for discharge with appropriate outpatient follow up. Diagnosis was discussed with patient who verbalizes understanding of care plan and is agreeable to discharge. I have discussed return precautions with patient who verbalizes understanding. Patient encouraged to follow-up with their PCP. All questions answered.  Patient's case discussed with Dr. Rogene Houston who agrees with plan to discharge with follow-up.    Note: Portions of this report may have been transcribed using voice recognition software. Every effort was made to ensure accuracy; however, inadvertent computerized transcription errors may still be present.         Final Clinical Impression(s) / ED Diagnoses Final diagnoses:  Lower abdominal pain  Cyst of right ovary  Hepatic steatosis  Atherosclerosis    Rx / DC Orders ED Discharge Orders          Ordered    cephALEXin (KEFLEX) 500 MG capsule  2 times daily        08/20/21 1733              Gari Crown 08/20/21 1736    Fredia Sorrow, MD 09/08/21 1525

## 2021-08-20 NOTE — ED Notes (Signed)
Patient transported to CT 

## 2021-08-20 NOTE — Discharge Instructions (Addendum)
At this time there does not appear to be the presence of an emergent medical condition, however there is always the potential for conditions to change. Please read and follow the below instructions.  Please return to the Emergency Department immediately for any new or worsening symptoms or if your symptoms do not improve within 2 days. Please be sure to follow up with your Primary Care Provider within one week regarding your visit today; please call their office to schedule an appointment even if you are feeling better for a follow-up visit. Please take your antibiotic keflex as prescribed until complete to help with your symptoms.  Please drink enough water to avoid dehydration and get plenty of rest. Your first CT scan shows questionable widening of your common bile duct.  Please call the gastroenterologist Dr. Laural Golden under discharge paperwork to discuss this result and to see if further testing such as MRCP is needed.  Go to the nearest Emergency Department immediately if: You have fever or chills Your pain does not go away as soon as your doctor says it should. You cannot stop vomiting. Your pain is only in areas of your belly, such as the right side or the left lower part of the belly. You have bloody or black poop, or poop that looks like tar. You have very bad pain, cramping, or bloating in your belly. You have signs of not having enough fluid or water in your body (dehydration), such as: Dark pee, very little pee, or no pee. Cracked lips. Dry mouth. Sunken eyes. Sleepiness. Weakness. You have trouble breathing or chest pain. You have pain in your belly that is very bad or gets worse. You have pain in the area between your hip bones, and the pain is very bad or gets worse. You cannot eat or drink without vomiting. You get a fever or chills all of a sudden. Your period is a lot heavier than usual. You have any new/concerning or worsening of symptoms.   Please read the additional  information packets attached to your discharge summary.  Do not take your medicine if  develop an itchy rash, swelling in your mouth or lips, or difficulty breathing; call 911 and seek immediate emergency medical attention if this occurs.  You may review your lab tests and imaging results in their entirety on your MyChart account.  Please discuss all results of fully with your primary care provider and other specialist at your follow-up visit.  Note: Portions of this text may have been transcribed using voice recognition software. Every effort was made to ensure accuracy; however, inadvertent computerized transcription errors may still be present.

## 2021-08-20 NOTE — ED Triage Notes (Signed)
Pt presents to ED with complaints of lower abdominal pain, states started as sharp pain at 0800 this morning, has eased some, also had nausea when first started but subsided now.

## 2021-08-23 ENCOUNTER — Encounter (HOSPITAL_COMMUNITY): Payer: Self-pay | Admitting: Emergency Medicine

## 2021-08-23 ENCOUNTER — Emergency Department (HOSPITAL_COMMUNITY)
Admission: EM | Admit: 2021-08-23 | Discharge: 2021-08-23 | Discharge status: Against Medical Advice | Payer: 59 | Attending: Emergency Medicine | Admitting: Emergency Medicine

## 2021-08-23 ENCOUNTER — Other Ambulatory Visit: Payer: Self-pay

## 2021-08-23 DIAGNOSIS — R110 Nausea: Secondary | ICD-10-CM | POA: Diagnosis not present

## 2021-08-23 DIAGNOSIS — N8301 Follicular cyst of right ovary: Secondary | ICD-10-CM | POA: Insufficient documentation

## 2021-08-23 DIAGNOSIS — N390 Urinary tract infection, site not specified: Secondary | ICD-10-CM | POA: Diagnosis not present

## 2021-08-23 DIAGNOSIS — R1030 Lower abdominal pain, unspecified: Secondary | ICD-10-CM | POA: Insufficient documentation

## 2021-08-23 LAB — URINE CULTURE: Culture: >=100000 — AB

## 2021-08-23 LAB — COMPREHENSIVE METABOLIC PANEL
ALT: 44 U/L (ref 0–44)
AST: 51 U/L — ABNORMAL HIGH (ref 15–41)
Albumin: 3.5 g/dL (ref 3.5–5.0)
Alkaline Phosphatase: 94 U/L (ref 38–126)
Anion gap: 9 (ref 5–15)
BUN: 14 mg/dL (ref 6–20)
CO2: 23 mmol/L (ref 22–32)
Calcium: 9.1 mg/dL (ref 8.9–10.3)
Chloride: 107 mmol/L (ref 98–111)
Creatinine, Ser: 0.74 mg/dL (ref 0.44–1.00)
GFR, Estimated: >60 mL/min (ref >60)
Glucose, Bld: 248 mg/dL — ABNORMAL HIGH (ref 70–99)
Potassium: 3.5 mmol/L (ref 3.5–5.1)
Sodium: 139 mmol/L (ref 135–145)
Total Bilirubin: 0.4 mg/dL (ref 0.3–1.2)
Total Protein: 7 g/dL (ref 6.5–8.1)

## 2021-08-23 LAB — CBC
HCT: 43 % (ref 36.0–46.0)
Hemoglobin: 14.3 g/dL (ref 12.0–15.0)
MCH: 29.1 pg (ref 26.0–34.0)
MCHC: 33.3 g/dL (ref 30.0–36.0)
MCV: 87.4 fL (ref 80.0–100.0)
Platelets: 248 10*3/uL (ref 150–400)
RBC: 4.92 MIL/uL (ref 3.87–5.11)
RDW: 13.3 % (ref 11.5–15.5)
WBC: 8.5 10*3/uL (ref 4.0–10.5)
nRBC: 0 % (ref 0.0–0.2)

## 2021-08-23 LAB — LIPASE, BLOOD: Lipase: 34 U/L (ref 11–51)

## 2021-08-23 NOTE — ED Triage Notes (Signed)
Pt having lower abdominal pain with nausea, seen for same on Sunday, diagnosed with UTI, and cyst on right ovary.

## 2021-08-24 ENCOUNTER — Telehealth: Payer: Self-pay | Admitting: *Deleted

## 2021-08-24 NOTE — Telephone Encounter (Signed)
Post ED Visit - Positive Culture Follow-up  Culture report reviewed by antimicrobial stewardship pharmacist: [place] Team '[]'$  Elenor Quinones, Pharm.D. '[]'$  Heide Guile, Pharm.D., BCPS AQ-ID '[]'$  Parks Neptune, Pharm.D., BCPS '[]'$  Alycia Rossetti, Pharm.D., BCPS '[]'$  [place], Pharm.D., BCPS, AAHIVP '[]'$  Legrand Como, Pharm.D., BCPS, AAHIVP '[]'$  Salome Arnt, PharmD, BCPS '[]'$  Johnnette Gourd, PharmD, BCPS '[]'$  Hughes Better, PharmD, BCPS '[]'$  Leeroy Cha, PharmD '[]'$  Laqueta Linden, PharmD, BCPS '[]'$  Albertina Parr, PharmD  [place] Team '[]'$  Leodis Sias, PharmD '[]'$  Lindell Spar, PharmD '[]'$  Royetta Asal, PharmD '[]'$  Graylin Shiver, Rph '[]'$  Rema Fendt) Glennon Mac, PharmD '[]'$  Arlyn Dunning, PharmD '[]'$  Netta Cedars, PharmD '[]'$  Dia Sitter, PharmD '[]'$  Leone Haven, PharmD '[]'$  Gretta Arab, PharmD '[]'$  Theodis Shove, PharmD '[]'$  Peggyann Juba, PharmD '[]'$  Reuel Boom, PharmD   Positive urine culture Asymptomatic bacteruria, no antibiotics indicated and no further patient follow-up is required at this time.  Harlon Flor [facility] 08/24/2021, 9:21 AM

## 2021-08-24 NOTE — Progress Notes (Signed)
ED Antimicrobial Stewardship Positive Culture Follow Up   Christina Estrada is an 51 y.o. female who presented to [facility] on 08/20/2021 with a chief complaint of  Chief Complaint  Patient presents with   Abdominal Pain    Recent Results (from the past 720 hour(s))  Urine Culture     Status: Abnormal   Collection Time: 08/20/21 11:45 AM   Specimen: Urine, Clean Catch  Result Value Ref Range Status   Specimen Description   Final    URINE, CLEAN CATCH Performed at [facility], [address] [place], [place] [address]    Special Requests   Final    NONE Performed at [facility], [address] [place], [place] [address]    Culture >=100,000 COLONIES/mL ENTEROBACTER CLOACAE (A)  Final   Report Status 08/23/2021 FINAL  Final   Organism ID, Bacteria ENTEROBACTER CLOACAE (A)  Final      Susceptibility   Enterobacter cloacae - MIC*    CEFAZOLIN >=64 RESISTANT Resistant     CEFEPIME <=0.12 SENSITIVE Sensitive     CIPROFLOXACIN <=0.25 SENSITIVE Sensitive     GENTAMICIN <=1 SENSITIVE Sensitive     IMIPENEM <=0.25 SENSITIVE Sensitive     NITROFURANTOIN 64 INTERMEDIATE Intermediate     TRIMETH/SULFA <=20 SENSITIVE Sensitive     PIP/TAZO 32 INTERMEDIATE Intermediate     * >=100,000 COLONIES/mL ENTEROBACTER CLOACAE    '[x]'$  Treated with cephalexin, organism resistant to prescribed antimicrobial  Per MD, no additional antibiotics indicated as presentation is likely due to ovarian cyst, not UTI.  ED Provider: Nanda Quinton, MD    Pauletta Browns 08/24/2021, 9:03 AM Clinical Pharmacist Monday - Friday phone -  [phone] Saturday - Sunday phone - [phone]

## 2021-08-25 ENCOUNTER — Emergency Department (HOSPITAL_COMMUNITY)
Admission: EM | Admit: 2021-08-25 | Discharge: 2021-08-25 | Disposition: A | Discharge status: Home / Self Care | Payer: 59 | Attending: Emergency Medicine | Admitting: Emergency Medicine

## 2021-08-25 ENCOUNTER — Encounter (HOSPITAL_COMMUNITY): Payer: Self-pay | Admitting: *Deleted

## 2021-08-25 ENCOUNTER — Other Ambulatory Visit: Payer: Self-pay

## 2021-08-25 DIAGNOSIS — Z7984 Long term (current) use of oral hypoglycemic drugs: Secondary | ICD-10-CM | POA: Insufficient documentation

## 2021-08-25 DIAGNOSIS — N390 Urinary tract infection, site not specified: Secondary | ICD-10-CM | POA: Diagnosis not present

## 2021-08-25 DIAGNOSIS — R1032 Left lower quadrant pain: Secondary | ICD-10-CM | POA: Diagnosis present

## 2021-08-25 DIAGNOSIS — E119 Type 2 diabetes mellitus without complications: Secondary | ICD-10-CM | POA: Diagnosis not present

## 2021-08-25 LAB — URINALYSIS, ROUTINE W REFLEX MICROSCOPIC
Bilirubin Urine: NEGATIVE
Glucose, UA: >=500 mg/dL — AB
Hgb urine dipstick: NEGATIVE
Ketones, ur: 20 mg/dL — AB
Nitrite: POSITIVE — AB
Protein, ur: NEGATIVE mg/dL
Specific Gravity, Urine: 1.029 (ref 1.005–1.030)
WBC, UA: >50 WBC/hpf — ABNORMAL HIGH (ref 0–5)
pH: 5 (ref 5.0–8.0)

## 2021-08-25 LAB — CBC WITH DIFFERENTIAL/PLATELET
Abs Immature Granulocytes: 0.05 10*3/uL (ref 0.00–0.07)
Basophils Absolute: 0 10*3/uL (ref 0.0–0.1)
Basophils Relative: 1 %
Eosinophils Absolute: 0.1 10*3/uL (ref 0.0–0.5)
Eosinophils Relative: 2 %
HCT: 40.8 % (ref 36.0–46.0)
Hemoglobin: 13.5 g/dL (ref 12.0–15.0)
Immature Granulocytes: 1 %
Lymphocytes Relative: 26 %
Lymphs Abs: 1.8 10*3/uL (ref 0.7–4.0)
MCH: 28.8 pg (ref 26.0–34.0)
MCHC: 33.1 g/dL (ref 30.0–36.0)
MCV: 87.2 fL (ref 80.0–100.0)
Monocytes Absolute: 0.6 10*3/uL (ref 0.1–1.0)
Monocytes Relative: 8 %
Neutro Abs: 4.5 10*3/uL (ref 1.7–7.7)
Neutrophils Relative %: 62 %
Platelets: 271 10*3/uL (ref 150–400)
RBC: 4.68 MIL/uL (ref 3.87–5.11)
RDW: 13.3 % (ref 11.5–15.5)
WBC: 7.1 10*3/uL (ref 4.0–10.5)
nRBC: 0 % (ref 0.0–0.2)

## 2021-08-25 LAB — COMPREHENSIVE METABOLIC PANEL
ALT: 33 U/L (ref 0–44)
AST: 24 U/L (ref 15–41)
Albumin: 3.4 g/dL — ABNORMAL LOW (ref 3.5–5.0)
Alkaline Phosphatase: 71 U/L (ref 38–126)
Anion gap: 5 (ref 5–15)
BUN: 10 mg/dL (ref 6–20)
CO2: 27 mmol/L (ref 22–32)
Calcium: 8.5 mg/dL — ABNORMAL LOW (ref 8.9–10.3)
Chloride: 108 mmol/L (ref 98–111)
Creatinine, Ser: 0.48 mg/dL (ref 0.44–1.00)
GFR, Estimated: >60 mL/min (ref >60)
Glucose, Bld: 170 mg/dL — ABNORMAL HIGH (ref 70–99)
Potassium: 3.9 mmol/L (ref 3.5–5.1)
Sodium: 140 mmol/L (ref 135–145)
Total Bilirubin: 0.4 mg/dL (ref 0.3–1.2)
Total Protein: 6.9 g/dL (ref 6.5–8.1)

## 2021-08-25 LAB — LIPASE, BLOOD: Lipase: 32 U/L (ref 11–51)

## 2021-08-25 MED ORDER — SODIUM CHLORIDE 0.9 % IV SOLN
1.0000 g | Freq: Once | INTRAVENOUS | Status: AC
  Administered 2021-08-25: 1 g via INTRAVENOUS
  Filled 2021-08-25: qty 10

## 2021-08-25 MED ORDER — HYDROMORPHONE HCL 1 MG/ML IJ SOLN
1.0000 mg | Freq: Once | INTRAMUSCULAR | Status: AC
  Administered 2021-08-25: 1 mg via INTRAVENOUS
  Filled 2021-08-25: qty 1

## 2021-08-25 MED ORDER — ONDANSETRON HCL 4 MG/2ML IJ SOLN
4.0000 mg | Freq: Once | INTRAMUSCULAR | Status: AC
  Administered 2021-08-25: 4 mg via INTRAVENOUS
  Filled 2021-08-25: qty 2

## 2021-08-25 MED ORDER — CEFPODOXIME PROXETIL 200 MG PO TABS: 200.0000 mg | ORAL_TABLET | Freq: Two times a day (BID) | ORAL | 0 refills | Status: DC

## 2021-08-25 NOTE — ED Provider Notes (Signed)
[facility] EMERGENCY DEPARTMENT Provider Note   CSN: [id number] Arrival date & time: 08/25/21  8938     History  Chief Complaint  Patient presents with   Abdominal Pain    Christina Estrada is a 51 y.o. female.   Abdominal Pain  Patient with medical history notable for diabetes, C-section, status post cholecystectomy presents today with left lower quadrant abdominal pain.  She was seen for this in the ED 1 week ago and diagnosed with a UTI.  CT scan at that time did not show any acute process.  States that she has been taking the Keflex, the pain initially improved but it flared up again today.  The pain is to the left lower quadrant, it is sharp.  It comes and goes, unable to identify any provoking features.  Pain medicine typically relieves the pain.  Associated with nausea but no vomiting.  Denies any dysuria or hematuria, no flank pain, fevers, chest pain, vaginal pain, shortness of breath, diarrhea, change in bowel habits.  Home Medications Prior to Admission medications   Medication Sig Start Date End Date Taking? Authorizing Provider  cefpodoxime (VANTIN) 200 MG tablet Take 1 tablet (200 mg total) by mouth 2 (two) times daily. 08/25/21  Yes Sherrill Raring, PA-C  ibuprofen (ADVIL,MOTRIN) 200 MG tablet Take 600-800 mg by mouth every 6 (six) hours as needed for mild pain or moderate pain.   Yes [provider]  lisinopril (ZESTRIL) 5 MG tablet Take 5 mg by mouth daily. 08/01/21  Yes [provider]  metFORMIN (GLUCOPHAGE) 500 MG tablet Take by mouth 2 (two) times daily with a meal.   Yes [provider]  alclomethasone (ACLOVATE) 0.05 % cream Apply topically 2 (two) times daily as needed (Rash). Patient not taking: Reported on 08/25/2021 08/18/20   Warren Danes, PA-C      Allergies    Patient has no known allergies.    Review of Systems   Review of Systems  Gastrointestinal:  Positive for abdominal pain.   Physical Exam Updated Vital Signs BP  117/74 (BP Location: Left Arm)   Pulse 72   Temp 98.3 F (36.8 C) (Oral)   Resp 18   Ht '5\' 7"'$  (1.702 m)   Wt (!) 141 kg   LMP 08/13/2021   SpO2 94%   BMI 48.69 kg/m  Physical Exam Vitals and nursing note reviewed. Exam conducted with a chaperone present.  Constitutional:      Appearance: Normal appearance.     Comments: BMI 48.69  HENT:     Head: Normocephalic and atraumatic.  Eyes:     General: No scleral icterus.       Right eye: No discharge.        Left eye: No discharge.     Extraocular Movements: Extraocular movements intact.     Pupils: Pupils are equal, round, and reactive to light.  Cardiovascular:     Rate and Rhythm: Normal rate and regular rhythm.     Pulses: Normal pulses.     Heart sounds: Normal heart sounds. No murmur heard.   No friction rub. No gallop.  Pulmonary:     Effort: Pulmonary effort is normal. No respiratory distress.     Breath sounds: Normal breath sounds.  Abdominal:     General: Abdomen is flat. A surgical scar is present. Bowel sounds are normal. There is no distension.     Palpations: Abdomen is soft.     Tenderness: There is abdominal tenderness in  the left lower quadrant. There is no guarding or rebound. Negative signs include Rovsing's sign and McBurney's sign.     Comments: Abdomen is soft, left lower quadrant tenderness.  No rigidity or guarding  Skin:    General: Skin is warm and dry.     Coloration: Skin is not jaundiced.  Neurological:     Mental Status: She is alert. Mental status is at baseline.     Coordination: Coordination normal.    ED Results / Procedures / Treatments   Labs (all labs ordered are listed, but only abnormal results are displayed) Labs Reviewed  COMPREHENSIVE METABOLIC PANEL - Abnormal; Notable for the following components:      Result Value   Glucose, Bld 170 (*)    Calcium 8.5 (*)    Albumin 3.4 (*)    All other components within normal limits  URINALYSIS, ROUTINE W REFLEX MICROSCOPIC - Abnormal;  Notable for the following components:   APPearance HAZY (*)    Glucose, UA >=500 (*)    Ketones, ur 20 (*)    Nitrite POSITIVE (*)    Leukocytes,Ua SMALL (*)    WBC, UA >50 (*)    Bacteria, UA RARE (*)    All other components within normal limits  URINE CULTURE  CBC WITH DIFFERENTIAL/PLATELET  LIPASE, BLOOD    EKG None  Radiology No results found.  Procedures Procedures    Medications Ordered in ED Medications  HYDROmorphone (DILAUDID) injection 1 mg (1 mg Intravenous Given 08/25/21 0953)  ondansetron (ZOFRAN) injection 4 mg (4 mg Intravenous Given 08/25/21 0953)  cefTRIAXone (ROCEPHIN) 1 g in sodium chloride 0.9 % 100 mL IVPB (1 g Intravenous New Bag/Given 08/25/21 1238)    ED Course/ Medical Decision Making/ A&P                           Medical Decision Making Amount and/or Complexity of Data Reviewed Independent Historian: parent    Details: Mother at bedside Labs: ordered.  Risk Prescription drug management.   Patient presents with left lower quadrant pain. Pain.  DifficultDifferential diagnosis includes but is not limited to UTI, pyelonephritis, nephrolithiasis, diverticulitis, colitis, ovarian cyst, ovarian torsion.  On exam patient's abdomen is without any peritoneal signs.  There is left lower quadrant pain but no left CVA tenderness or right CVA tenderness.  She is slightly hypertensive but vitals are otherwise unremarkable.  I reviewed the CT scan of the abdomen pelvis from 08/20/2021. There were no acute findings, there was a 3.2 cm benign-appearing right ovarian cyst.  No evidence of diverticulitis  Additional history obtained:   Independent historian: Patient's mother   Lab Tests:  I ordered, viewed, and personally interpreted labs.  The pertinent results include: No leukocytosis or anemia.  Lipase is within normal limits, CMP without AKI or gross electrolyte derangement.  UA is nitrite and leukocyte positive.  Urine culture obtained.  Significant  glucosuria but no evidence of underlying DKA.  ECG/Cardiac monitoring:    The patient was maintained on a cardiac monitor.  Visualized monitor strip which showed NSR per my interpretation.    Medicines ordered and prescription drug management:  I ordered medication including: Rocephin, Zofran, Dilaudid  I have reviewed the patients home medicines and have made adjustments as needed   Test Considered:  I considered repeat CT imaging but she has had 2 CTs in the last week.  Her vital signs are stable, she does not have any leukocytosis or  concerning new findings on laboratory work-up.  She has a UTI, failing pyelois a possibility but she is tolerating p.o., not febrile and there is no CVA tenderness or ascending pain on my exam.  We engaged in shared decision-making and will not repeat CT today.   Reevaluation:  After the interventions noted above, I reevaluated the patient and found resting comfortably, no recurrent pain   Problems addressed / ED Course: UTI-given Rocephin here in the ED.  Will discharge on cefpodoxime.  Strict return precautions and follow-up with primary on Monday discussed with patient.  Urine culture also obtained and sent. Doubt nephrolithiasis given no flank pain or hematuria.  Doubt septic given no SIRS criteria, no leukocytosis.  No evidence AKI or dehydration, no evidence of DKA.    Patient tolerating p.o., UTI and possible developing pyelonephritis.  Considered admission but ultimately think patient is able to tolerate outpatient antibiotics with strict return precautions and close follow-up.   Social Determinants of Health:    Disposition:   After consideration of the diagnostic results and the patients response to treatment, I feel that the patent would benefit from outpatient follow-up.  Discussed with my attending Dr. Kathrynn Humble who agrees with this plan.        Final Clinical Impression(s) / ED Diagnoses Final diagnoses:  Lower urinary  tract infectious disease    Rx / DC Orders ED Discharge Orders          Ordered    cefpodoxime (VANTIN) 200 MG tablet  2 times daily        08/25/21 [place], Nasha Diss, PA-C 08/25/21 [place], Ankit, MD 08/27/21 [phone]

## 2021-08-25 NOTE — ED Triage Notes (Signed)
Pt c/o left lower quadrant pain x 4 days; pt has been seen multiple times this week for the same complaint; pt has some nausea

## 2021-08-25 NOTE — ED Notes (Addendum)
Pt c/o LLQ pain and nausea x4 days.  Pt reports pain is constant but has waves of higher intensity.   Pt was seen 5/21 for same and told to follow up w/ PCP and GI.

## 2021-08-25 NOTE — Discharge Instructions (Addendum)
Take Vantin twice daily for the next 10 days.  Stop taking the Keflex.  Follow-up with your primary on Monday for reevaluation and GI as planned.  Return to the ED if you have new or worsening symptoms.

## 2021-08-27 LAB — URINE CULTURE: Culture: >=100000 — AB

## 2021-08-28 NOTE — Progress Notes (Signed)
ED Antimicrobial Stewardship Positive Culture Follow Up   Christina Estrada is an 51 y.o. female who presented to [facility] on 08/25/2021 with a chief complaint of  Chief Complaint  Patient presents with   Abdominal Pain    Recent Results (from the past 720 hour(s))  Urine Culture     Status: Abnormal   Collection Time: 08/20/21 11:45 AM   Specimen: Urine, Clean Catch  Result Value Ref Range Status   Specimen Description   Final    URINE, CLEAN CATCH Performed at [facility], [address] [place], [place] [address]    Special Requests   Final    NONE Performed at [facility], [address], [place], [place] [address]    Culture >=100,000 COLONIES/mL ENTEROBACTER CLOACAE (A)  Final   Report Status 08/23/2021 FINAL  Final   Organism ID, Bacteria ENTEROBACTER CLOACAE (A)  Final      Susceptibility   Enterobacter cloacae - MIC*    CEFAZOLIN >=64 RESISTANT Resistant     CEFEPIME <=0.12 SENSITIVE Sensitive     CIPROFLOXACIN <=0.25 SENSITIVE Sensitive     GENTAMICIN <=1 SENSITIVE Sensitive     IMIPENEM <=0.25 SENSITIVE Sensitive     NITROFURANTOIN 64 INTERMEDIATE Intermediate     TRIMETH/SULFA <=20 SENSITIVE Sensitive     PIP/TAZO 32 INTERMEDIATE Intermediate     * >=100,000 COLONIES/mL ENTEROBACTER CLOACAE  Urine Culture     Status: Abnormal   Collection Time: 08/25/21  9:33 AM   Specimen: Urine, Clean Catch  Result Value Ref Range Status   Specimen Description   Final    URINE, CLEAN CATCH Performed at [facility], [address], [place], [place] [address]    Special Requests   Final    NONE Performed at [facility], [address] [place], [place] [address]    Culture >=100,000 COLONIES/mL ENTEROBACTER CLOACAE (A)  Final   Report Status 08/27/2021 FINAL  Final   Organism ID, Bacteria ENTEROBACTER CLOACAE (A)  Final      Susceptibility   Enterobacter cloacae - MIC*    CEFAZOLIN >=64 RESISTANT Resistant     CEFEPIME <=0.12 SENSITIVE Sensitive     CIPROFLOXACIN  <=0.25 SENSITIVE Sensitive     GENTAMICIN <=1 SENSITIVE Sensitive     IMIPENEM <=0.25 SENSITIVE Sensitive     NITROFURANTOIN 64 INTERMEDIATE Intermediate     TRIMETH/SULFA <=20 SENSITIVE Sensitive     PIP/TAZO 16 SENSITIVE Sensitive     * >=100,000 COLONIES/mL ENTEROBACTER CLOACAE    '[x]'$  Treated with cefpodoxime, organism resistant to prescribed antimicrobial  If patient is not improving clinically, recommend bactrim DS x 5 days.  ED Provider: Gareth Morgan, MD   Levonne Spiller 08/28/2021, 3:52 PM Clinical Pharmacist Monday - Friday phone -  [phone] Saturday - Sunday phone - [phone]

## 2021-08-29 ENCOUNTER — Telehealth: Payer: Self-pay | Admitting: Emergency Medicine

## 2021-08-29 NOTE — Telephone Encounter (Signed)
Post ED Visit - Positive Culture Follow-up: Successful Patient Follow-Up  Culture assessed and recommendations reviewed by:  '[]'$  Elenor Quinones, Pharm.D. '[]'$  Heide Guile, Pharm.D., BCPS AQ-ID '[]'$  Parks Neptune, Pharm.D., BCPS '[]'$  Alycia Rossetti, Pharm.D., BCPS '[]'$  [place], [place].D., BCPS, AAHIVP '[]'$  Legrand Como, Pharm.D., BCPS, AAHIVP '[]'$  Salome Arnt, PharmD, BCPS '[]'$  Johnnette Gourd, PharmD, BCPS '[]'$  Hughes Better, PharmD, BCPS '[]'$  Leeroy Cha, PharmD Porfirio Oar PharmD  Positive urine culture  '[]'$  Patient discharged without antimicrobial prescription and treatment is now indicated '[]'$  Organism is resistant to prescribed ED discharge antimicrobial '[]'$  Patient with positive blood cultures  Changes discussed with ED provider: Dr Gareth Morgan New antibiotic prescription symptom check, is symptomatic, start Bactrim x 5 days Patient states that symptoms have resolved and she feels a lot better, no further treatment needed  Contacted patient 08/29/2021 0815   Hazle Nordmann 08/29/2021, 8:18 AM

## 2021-10-28 ENCOUNTER — Emergency Department (HOSPITAL_COMMUNITY): Payer: 59

## 2021-10-28 ENCOUNTER — Emergency Department (HOSPITAL_COMMUNITY)
Admission: EM | Admit: 2021-10-28 | Discharge: 2021-10-29 | Disposition: A | Discharge status: Home / Self Care | Payer: 59 | Attending: Emergency Medicine | Admitting: Emergency Medicine

## 2021-10-28 DIAGNOSIS — E119 Type 2 diabetes mellitus without complications: Secondary | ICD-10-CM | POA: Diagnosis not present

## 2021-10-28 DIAGNOSIS — I471 Supraventricular tachycardia: Secondary | ICD-10-CM

## 2021-10-28 DIAGNOSIS — R000 Tachycardia, unspecified: Secondary | ICD-10-CM | POA: Diagnosis present

## 2021-10-28 DIAGNOSIS — I10 Essential (primary) hypertension: Secondary | ICD-10-CM | POA: Insufficient documentation

## 2021-10-28 LAB — CBC WITH DIFFERENTIAL/PLATELET
Abs Immature Granulocytes: 0.1 10*3/uL — ABNORMAL HIGH (ref 0.00–0.07)
Basophils Absolute: 0.1 10*3/uL (ref 0.0–0.1)
Basophils Relative: 0 %
Eosinophils Absolute: 0.1 10*3/uL (ref 0.0–0.5)
Eosinophils Relative: 1 %
HCT: 47.5 % — ABNORMAL HIGH (ref 36.0–46.0)
Hemoglobin: 15.9 g/dL — ABNORMAL HIGH (ref 12.0–15.0)
Immature Granulocytes: 1 %
Lymphocytes Relative: 28 %
Lymphs Abs: 3.8 10*3/uL (ref 0.7–4.0)
MCH: 28.7 pg (ref 26.0–34.0)
MCHC: 33.5 g/dL (ref 30.0–36.0)
MCV: 85.7 fL (ref 80.0–100.0)
Monocytes Absolute: 1 10*3/uL (ref 0.1–1.0)
Monocytes Relative: 7 %
Neutro Abs: 8.8 10*3/uL — ABNORMAL HIGH (ref 1.7–7.7)
Neutrophils Relative %: 63 %
Platelets: 318 10*3/uL (ref 150–400)
RBC: 5.54 MIL/uL — ABNORMAL HIGH (ref 3.87–5.11)
RDW: 13.3 % (ref 11.5–15.5)
WBC: 13.9 10*3/uL — ABNORMAL HIGH (ref 4.0–10.5)
nRBC: 0 % (ref 0.0–0.2)

## 2021-10-28 LAB — COMPREHENSIVE METABOLIC PANEL
ALT: 97 U/L — ABNORMAL HIGH (ref 0–44)
AST: 100 U/L — ABNORMAL HIGH (ref 15–41)
Albumin: 3.6 g/dL (ref 3.5–5.0)
Alkaline Phosphatase: 147 U/L — ABNORMAL HIGH (ref 38–126)
Anion gap: 9 (ref 5–15)
BUN: 15 mg/dL (ref 6–20)
CO2: 23 mmol/L (ref 22–32)
Calcium: 8.9 mg/dL (ref 8.9–10.3)
Chloride: 104 mmol/L (ref 98–111)
Creatinine, Ser: 0.79 mg/dL (ref 0.44–1.00)
GFR, Estimated: >60 mL/min (ref >60)
Glucose, Bld: 472 mg/dL — ABNORMAL HIGH (ref 70–99)
Potassium: 4.1 mmol/L (ref 3.5–5.1)
Sodium: 136 mmol/L (ref 135–145)
Total Bilirubin: 0.6 mg/dL (ref 0.3–1.2)
Total Protein: 7.5 g/dL (ref 6.5–8.1)

## 2021-10-28 LAB — TROPONIN I (HIGH SENSITIVITY): Troponin I (High Sensitivity): 12 ng/L (ref <18)

## 2021-10-28 LAB — TSH: TSH: 2.742 u[IU]/mL (ref 0.350–4.500)

## 2021-10-28 MED ORDER — SODIUM CHLORIDE 0.9 % IV BOLUS
250.0000 mL | Freq: Once | INTRAVENOUS | Status: AC
  Administered 2021-10-28: 250 mL via INTRAVENOUS

## 2021-10-28 MED ORDER — INSULIN ASPART 100 UNIT/ML IV SOLN
8.0000 [IU] | Freq: Once | INTRAVENOUS | Status: AC
  Administered 2021-10-28: 8 [IU] via INTRAVENOUS

## 2021-10-28 MED ORDER — ADENOSINE 6 MG/2ML IV SOLN
INTRAVENOUS | Status: AC
  Filled 2021-10-28: qty 6

## 2021-10-28 MED ORDER — SODIUM CHLORIDE 0.9 % IV BOLUS
1000.0000 mL | Freq: Once | INTRAVENOUS | Status: AC
  Administered 2021-10-28: 1000 mL via INTRAVENOUS

## 2021-10-28 NOTE — ED Notes (Signed)
Pt VS completed in triage. HR 195. Pt placed in room. Dr. Roderic Palau bedside to do assessment of patient. Charge Mudlogger in room also.

## 2021-10-28 NOTE — ED Notes (Signed)
AED pads placed on pt- monitoring in place.

## 2021-10-28 NOTE — ED Triage Notes (Signed)
Pt reports not feeling good today. Pt feels like her heart is racing.

## 2021-10-28 NOTE — Progress Notes (Signed)
2304: 6 mg of Adenosine given by Benjamine Mola, RN for SVT with rate of 190. Pt immediately converted to ST  rate 118. She tolerated well. Bryson Corona Edd Fabian

## 2021-10-28 NOTE — ED Provider Notes (Signed)
[facility] Provider Note   CSN: [id number] Arrival date & time: 10/28/21  2234     History  Chief Complaint  Patient presents with   Tachycardia    Christina Estrada is a 51 y.o. female.  Patient is a 51 year old female with past medical history of hypertension and diabetes.  Patient presenting today with complaints of palpitations.  This started approximately 2 hours prior to presentation.  She describes rapid heartbeat and feeling somewhat weak.  She denies having any chest pain, shortness of breath, fevers, or other complaints.  There are no aggravating or alleviating factors.  She has no prior history of palpitations or diagnosed arrhythmias.  The history is provided by the patient.       Home Medications Prior to Admission medications   Medication Sig Start Date End Date Taking? Authorizing Provider  alclomethasone (ACLOVATE) 0.05 % cream Apply topically 2 (two) times daily as needed (Rash). Patient not taking: Reported on 08/25/2021 08/18/20   Robyne Askew R, PA-C  cefpodoxime (VANTIN) 200 MG tablet Take 1 tablet (200 mg total) by mouth 2 (two) times daily. 08/25/21   Sherrill Raring, PA-C  ibuprofen (ADVIL,MOTRIN) 200 MG tablet Take 600-800 mg by mouth every 6 (six) hours as needed for mild pain or moderate pain.    [provider]  lisinopril (ZESTRIL) 5 MG tablet Take 5 mg by mouth daily. 08/01/21   [provider]  metFORMIN (GLUCOPHAGE) 500 MG tablet Take by mouth 2 (two) times daily with a meal.    [provider]      Allergies    Patient has no known allergies.    Review of Systems   Review of Systems  All other systems reviewed and are negative.   Physical Exam Updated Vital Signs BP 103/89   Pulse (!) 118   Temp 97.7 F (36.5 C) (Oral)   Resp 19   SpO2 98%  Physical Exam Vitals and nursing note reviewed.  Constitutional:      General: She is not in acute distress.    Appearance: She is well-developed. She  is not diaphoretic.  HENT:     Head: Normocephalic and atraumatic.  Cardiovascular:     Rate and Rhythm: Regular rhythm. Tachycardia present.     Heart sounds: No murmur heard.    No friction rub. No gallop.  Pulmonary:     Effort: Pulmonary effort is normal. No respiratory distress.     Breath sounds: Normal breath sounds. No wheezing.  Abdominal:     General: Bowel sounds are normal. There is no distension.     Palpations: Abdomen is soft.     Tenderness: There is no abdominal tenderness.  Musculoskeletal:        General: Normal range of motion.     Cervical back: Normal range of motion and neck supple.  Skin:    General: Skin is warm and dry.  Neurological:     General: No focal deficit present.     Mental Status: She is alert and oriented to person, place, and time.     ED Results / Procedures / Treatments   Labs (all labs ordered are listed, but only abnormal results are displayed) Labs Reviewed  CBC WITH DIFFERENTIAL/PLATELET - Abnormal; Notable for the following components:      Result Value   WBC 13.9 (*)    RBC 5.54 (*)    Hemoglobin 15.9 (*)    HCT 47.5 (*)    Neutro Abs  8.8 (*)    Abs Immature Granulocytes 0.10 (*)    All other components within normal limits  COMPREHENSIVE METABOLIC PANEL - Abnormal; Notable for the following components:   Glucose, Bld 472 (*)    AST 100 (*)    ALT 97 (*)    Alkaline Phosphatase 147 (*)    All other components within normal limits  TSH  TROPONIN I (HIGH SENSITIVITY)    EKG EKG Interpretation  Date/Time:  Saturday October 28 2021 22:44:29 EDT Ventricular Rate:  190 PR Interval:    QRS Duration: 92 QT Interval:  272 QTC Calculation: 484 R Axis:   -57 Text Interpretation: Supraventricular tachycardia Left axis deviation Confirmed by Veryl Speak [phone]) on 10/28/2021 11:08:57 PM  Radiology DG Chest Port 1 View  Result Date: 10/28/2021 CLINICAL DATA:  Shortness of breath and tachycardia. EXAM: PORTABLE CHEST 1 VIEW  COMPARISON:  None Available. FINDINGS: The heart size and mediastinal contours are within normal limits. Both lungs are clear. The visualized skeletal structures are unremarkable. IMPRESSION: No active disease. Electronically Signed   By: Ronney Asters M.D.   On: 10/28/2021 23:13    Procedures Procedures  {Document cardiac monitor, telemetry assessment procedure when appropriate:1}  Medications Ordered in ED Medications  sodium chloride 0.9 % bolus 250 mL (has no administration in time range)  sodium chloride 0.9 % bolus 1,000 mL (has no administration in time range)  insulin aspart (novoLOG) injection 8 Units (has no administration in time range)  adenosine (ADENOCARD) 6 MG/2ML injection (  Given 10/28/21 2304)    ED Course/ Medical Decision Making/ A&P                           Medical Decision Making Amount and/or Complexity of Data Reviewed Labs: ordered.  Risk OTC drugs.   ***  {Document critical care time when appropriate:1} {Document review of labs and clinical decision tools ie heart score, Chads2Vasc2 etc:1}  {Document your independent review of radiology images, and any outside records:1} {Document your discussion with family members, caretakers, and with consultants:1} {Document social determinants of health affecting pt's care:1} {Document your decision making why or why not admission, treatments were needed:1} Final Clinical Impression(s) / ED Diagnoses Final diagnoses:  None    Rx / DC Orders ED Discharge Orders     None

## 2021-10-28 NOTE — ED Provider Notes (Signed)
Patient states that she started feeling her heart beat fast couple hours ago.  This has not happened before she has not been sick recently.  Patient has a history of hypertension and diabetes and obesity.  Patient has a tachycardia of about 190.  Possible SVT possible atrial fibs but it looks regular.  We will get labs x-ray and give her some a Adenosine   Milton Ferguson, MD 10/28/21 2252

## 2021-10-29 LAB — URINALYSIS, ROUTINE W REFLEX MICROSCOPIC
Bilirubin Urine: NEGATIVE
Glucose, UA: NEGATIVE mg/dL
Hgb urine dipstick: NEGATIVE
Ketones, ur: NEGATIVE mg/dL
Leukocytes,Ua: NEGATIVE
Nitrite: NEGATIVE
Protein, ur: NEGATIVE mg/dL
Specific Gravity, Urine: 1 — ABNORMAL LOW (ref 1.005–1.030)
pH: 5 (ref 5.0–8.0)

## 2021-10-29 LAB — CBG MONITORING, ED: Glucose-Capillary: 343 mg/dL — ABNORMAL HIGH (ref 70–99)

## 2021-10-29 NOTE — Discharge Instructions (Signed)
Perform vagal maneuvers if symptoms recur.  If this does not resolve the palpitations, return to the ER to be evaluated.  Follow-up with your primary doctor and return to the ER if you experience any new and/or concerning symptoms.

## 2021-11-02 ENCOUNTER — Ambulatory Visit: Payer: 59 | Admitting: Family Medicine

## 2021-11-09 ENCOUNTER — Ambulatory Visit: Payer: 59 | Admitting: Family Medicine

## 2022-02-13 ENCOUNTER — Telehealth: Payer: Self-pay | Admitting: Adult Health

## 2022-02-13 NOTE — Telephone Encounter (Signed)
LVM asking pt to call back to reschedule 12/14 appointment - NP out

## 2022-03-15 ENCOUNTER — Ambulatory Visit: Payer: 59 | Admitting: Adult Health

## 2022-03-15 ENCOUNTER — Ambulatory Visit: Payer: 59 | Admitting: Family Medicine

## 2022-03-31 ENCOUNTER — Emergency Department (HOSPITAL_COMMUNITY)
Admission: EM | Admit: 2022-03-31 | Discharge: 2022-03-31 | Disposition: A | Discharge status: Home / Self Care | Payer: 59 | Attending: Emergency Medicine | Admitting: Emergency Medicine

## 2022-03-31 ENCOUNTER — Other Ambulatory Visit: Payer: Self-pay

## 2022-03-31 ENCOUNTER — Emergency Department (HOSPITAL_COMMUNITY): Payer: 59

## 2022-03-31 DIAGNOSIS — R002 Palpitations: Secondary | ICD-10-CM | POA: Insufficient documentation

## 2022-03-31 DIAGNOSIS — E119 Type 2 diabetes mellitus without complications: Secondary | ICD-10-CM | POA: Insufficient documentation

## 2022-03-31 DIAGNOSIS — Z87891 Personal history of nicotine dependence: Secondary | ICD-10-CM | POA: Diagnosis not present

## 2022-03-31 DIAGNOSIS — Z7984 Long term (current) use of oral hypoglycemic drugs: Secondary | ICD-10-CM | POA: Diagnosis not present

## 2022-03-31 DIAGNOSIS — R000 Tachycardia, unspecified: Secondary | ICD-10-CM | POA: Diagnosis not present

## 2022-03-31 LAB — CBC
HCT: 44.1 % (ref 36.0–46.0)
Hemoglobin: 14.5 g/dL (ref 12.0–15.0)
MCH: 27.6 pg (ref 26.0–34.0)
MCHC: 32.9 g/dL (ref 30.0–36.0)
MCV: 83.8 fL (ref 80.0–100.0)
Platelets: 270 10*3/uL (ref 150–400)
RBC: 5.26 MIL/uL — ABNORMAL HIGH (ref 3.87–5.11)
RDW: 14 % (ref 11.5–15.5)
WBC: 10.8 10*3/uL — ABNORMAL HIGH (ref 4.0–10.5)
nRBC: 0 % (ref 0.0–0.2)

## 2022-03-31 LAB — BASIC METABOLIC PANEL
Anion gap: 5 (ref 5–15)
BUN: 12 mg/dL (ref 6–20)
CO2: 25 mmol/L (ref 22–32)
Calcium: 8.9 mg/dL (ref 8.9–10.3)
Chloride: 109 mmol/L (ref 98–111)
Creatinine, Ser: 0.63 mg/dL (ref 0.44–1.00)
GFR, Estimated: >60 mL/min (ref >60)
Glucose, Bld: 143 mg/dL — ABNORMAL HIGH (ref 70–99)
Potassium: 3.7 mmol/L (ref 3.5–5.1)
Sodium: 139 mmol/L (ref 135–145)

## 2022-03-31 LAB — HCG, QUANTITATIVE, PREGNANCY: hCG, Beta Chain, Quant, S: <1 m[IU]/mL (ref <5)

## 2022-03-31 LAB — CBG MONITORING, ED: Glucose-Capillary: 165 mg/dL — ABNORMAL HIGH (ref 70–99)

## 2022-03-31 NOTE — Discharge Instructions (Signed)
Thank you for coming to [facility] Emergency Department. You were seen for palpitations similar to prior episode of SVT. We did an exam, labs, and imaging, and these showed no acute findings. We have referred you to cardiology for follow-up. They will call you to make an appointment.  Please follow up with your primary care provider within 1-2 weeks as already scheduled.  Do not hesitate to return to the ED or call 911 if you experience: -Worsening symptoms -Chest pain, shortness of breath -Lightheadedness, passing out -Fevers/chills -Anything else that concerns you

## 2022-03-31 NOTE — ED Triage Notes (Signed)
Pt states I feel like I'm in SVT x 1 hour. Denies pain feels like heart is racing.

## 2022-03-31 NOTE — ED Provider Triage Note (Addendum)
Emergency Medicine Provider Triage Evaluation Note  Christina Estrada , a 51 y.o. female  was evaluated in triage.  Pt complains of palpitations.  Started 2 PM this afternoon.  Patient felt heart fluttering sensation that lasted for an hour.  Denies associated shortness of breath and chest pain.  States that she was diagnosed with SVT and late August and required chemical conversion.  Is not currently being followed by cardiology.  States the sensation she felt her heart today is very similar to feeling when she had SVT.  At this time, no longer feeling the heart fluttering sensation.  Review of Systems  Positive: See above Negative: See above  Physical Exam  BP (!) 140/96 (BP Location: Right Arm)   Pulse (!) 116   Temp 98.1 F (36.7 C) (Temporal)   Resp 20   Ht '5\' 7"'$  (1.702 m)   Wt 136.1 kg   SpO2 95%   BMI 46.99 kg/m  Gen:   Awake, no distress   Resp:  Normal effort  MSK:   Moves extremities without difficulty  Other:    Medical Decision Making  Medically screening exam initiated at 4:28 PM.  Appropriate orders placed.  Christina Estrada was informed that the remainder of the evaluation will be completed by another provider, this initial triage assessment does not replace that evaluation, and the importance of remaining in the ED until their evaluation is complete.  Work up started      Christina Estrada, [place] 03/31/22 [phone]

## 2022-03-31 NOTE — ED Provider Notes (Incomplete)
[facility] EMERGENCY DEPARTMENT Provider Note   CSN: [id number] Arrival date & time: 03/31/22  1501     History {Add pertinent medical, surgical, social history, OB history to HPI:1} Chief Complaint  Patient presents with   Palpitations    Christina Estrada is a 51 y.o. female with morbid obesity, OSA, ASCUS, tobacco use, OA who presents with palpitations.  Patient reported acute onset palpitations that began at 2:00 PM this afternoon, lasted until 3:30 PM. Was at work, sitting at computer. Felt like prior episodes of SVT that occurred at end of July. Denies CP, SOB, lightheadedness, nausea/vomiting. No recent illnesses, f/c, cough, sick contacts. Now currently feels normal.   Per chart review she was in SVT rates 180s and given adenosine converted to NSR. W/u then demonstrated elevated blood glucose, improved w/ IV fluids/insulin. [place] home.   Does not currently follow with cardiology. Scheduled to see PCP in the next two weeks.    Palpitations      Home Medications Prior to Admission medications   Medication Sig Start Date End Date Taking? Authorizing Provider  alclomethasone (ACLOVATE) 0.05 % cream Apply topically 2 (two) times daily as needed (Rash). Patient not taking: Reported on 08/25/2021 08/18/20   Robyne Askew R, PA-C  cefpodoxime (VANTIN) 200 MG tablet Take 1 tablet (200 mg total) by mouth 2 (two) times daily. 08/25/21   Sherrill Raring, PA-C  ibuprofen (ADVIL,MOTRIN) 200 MG tablet Take 600-800 mg by mouth every 6 (six) hours as needed for mild pain or moderate pain.    [provider]  lisinopril (ZESTRIL) 5 MG tablet Take 5 mg by mouth daily. 08/01/21   [provider]  metFORMIN (GLUCOPHAGE) 500 MG tablet Take by mouth 2 (two) times daily with a meal.    [provider]      Allergies    Patient has no known allergies.    Review of Systems   Review of Systems  Cardiovascular:  Positive for palpitations.   Review of systems  {pos/neg:18640::"Negative","Positive"} for ***.  A 10 point review of systems was performed and is negative unless otherwise reported in HPI.  Physical Exam Updated Vital Signs BP (!) 132/100 (BP Location: Right Arm)   Pulse 89   Temp 98.4 F (36.9 C) (Oral)   Resp 14   Ht '5\' 7"'$  (1.702 m)   Wt 136.1 kg   SpO2 100%   BMI 46.99 kg/m  Physical Exam General: Normal appearing female, lying in bed.  HEENT: PERRLA, Sclera anicteric, MMM, trachea midline.  Cardiology: RRR, no murmurs/rubs/gallops. BL radial and DP pulses equal bilaterally.  Resp: Normal respiratory rate and effort. CTAB, no wheezes, rhonchi, crackles.  Abd: Soft, non-tender, non-distended. No rebound tenderness or guarding.  GU: Deferred. MSK: No peripheral edema or signs of trauma. Extremities without deformity or TTP. No cyanosis or clubbing. Skin: warm, dry. No rashes or lesions. Back: No CVA tenderness Neuro: A&Ox4, CNs II-XII grossly intact. MAEs. Sensation grossly intact.  Psych: Normal mood and affect.   ED Results / Procedures / Treatments   Labs (all labs ordered are listed, but only abnormal results are displayed) Labs Reviewed  BASIC METABOLIC PANEL - Abnormal; Notable for the following components:      Result Value   Glucose, Bld 143 (*)    All other components within normal limits  CBC - Abnormal; Notable for the following components:   WBC 10.8 (*)    RBC 5.26 (*)    All other components within normal limits  CBG MONITORING, ED - Abnormal; Notable for the following components:   Glucose-Capillary 165 (*)    All other components within normal limits  POC URINE PREG, ED    EKG EKG Interpretation  Date/Time:  Saturday March 31 2022 15:29:01 EST Ventricular Rate:  118 PR Interval:  170 QRS Duration: 84 QT Interval:  318 QTC Calculation: 445 R Axis:   -51 Text Interpretation: Sinus tachycardia Left anterior fascicular block Minimal voltage criteria for LVH, may be normal variant ( R in aVL )  Similar to prior When compared with ECG of 28-Oct-2021 23:05, PREVIOUS ECG IS PRESENT Confirmed by Cindee Lame [phone]) on 03/31/2022 4:02:11 PM  Radiology DG Chest 2 View  Result Date: 03/31/2022 CLINICAL DATA:  Palpitations EXAM: CHEST - 2 VIEW COMPARISON:  10/28/2021 FINDINGS: The heart size and mediastinal contours are within normal limits. Both lungs are clear. The visualized skeletal structures are unremarkable. IMPRESSION: No active cardiopulmonary disease. Electronically Signed   By: Elmer Picker M.D.   On: 03/31/2022 15:54    Procedures Procedures  {Document cardiac monitor, telemetry assessment procedure when appropriate:1}  Medications Ordered in ED Medications - No data to display  ED Course/ Medical Decision Making/ A&P                          Medical Decision Making Amount and/or Complexity of Data Reviewed Labs: ordered. Radiology: ordered.    This patient presents to the ED for concern of palpitations, this involves an extensive number of treatment options, and is a complaint that carries with it a high risk of complications and morbidity.  I considered the following differential and admission for this acute, potentially life threatening condition.   MDM:    ***     Labs: I Ordered, and personally interpreted labs.  The pertinent results include:    Imaging Studies ordered: I ordered imaging studies including *** I independently visualized and interpreted imaging. I agree with the radiologist interpretation  Additional history obtained from ***.  External records from outside source obtained and reviewed including ***  Cardiac Monitoring: The patient was maintained on a cardiac monitor.  I personally viewed and interpreted the cardiac monitored which showed an underlying rhythm of: ***  Reevaluation: After the interventions noted above, I reevaluated the patient and found that they have :{resolved/improved/worsened:23923::"improved"}  Social  Determinants of Health: ***  Disposition:  ***  Co morbidities that complicate the patient evaluation  Past Medical History:  Diagnosis Date   Diabetes mellitus without complication ([place])    Sleep apnea    Vaginal Pap smear, abnormal      Medicines No orders of the defined types were placed in this encounter.   I have reviewed the patients home medicines and have made adjustments as needed  Problem List / ED Course: Problem List Items Addressed This Visit   None        {Document critical care time when appropriate:1} {Document review of labs and clinical decision tools ie heart score, Chads2Vasc2 etc:1}  {Document your independent review of radiology images, and any outside records:1} {Document your discussion with family members, caretakers, and with consultants:1} {Document social determinants of health affecting pt's care:1} {Document your decision making why or why not admission, treatments were needed:1}  This note was created using dictation software, which may contain spelling or grammatical errors.

## 2022-05-17 ENCOUNTER — Encounter: Payer: Self-pay | Admitting: Cardiology

## 2022-05-17 ENCOUNTER — Ambulatory Visit: Payer: 59 | Attending: Cardiology | Admitting: Cardiology

## 2022-05-17 VITALS — BP 126/76 | HR 83 | Ht 67.0 in | Wt 306.0 lb

## 2022-05-17 DIAGNOSIS — I471 Supraventricular tachycardia, unspecified: Secondary | ICD-10-CM

## 2022-05-17 MED ORDER — METOPROLOL TARTRATE 25 MG PO TABS: 25.0000 mg | ORAL_TABLET | Freq: Two times a day (BID) | ORAL | 3 refills | Status: DC

## 2022-05-17 NOTE — Progress Notes (Signed)
Clinical Summary Christina Estrada is a 52 y.o.female seen today as a new consult, referred by Dr Mayra Neer for the following medical problems   1.Palpitations - ER visit 03/31/22 with palpitations - prior history of SVT with conversion with adenosine in ER 10/28/21 - about 2-3 episodes over the last several months  - rare coffee, tea every other day, rare sodas, no energy drinks. Rare etoh   2.OSA - on cpap  - followed by [facility]   Past Medical History:  Diagnosis Date   Diabetes mellitus without complication (H[facility])    Sleep apnea    Vaginal Pap smear, abnormal      No Known Allergies   Current Outpatient Medications  Medication Sig Dispense Refill   ibuprofen (ADVIL,MOTRIN) 200 MG tablet Take 600-800 mg by mouth every 6 (six) hours as needed for mild pain or moderate pain.     JARDIANCE 25 MG TABS tablet Take 1 tablet by mouth daily. (Patient not taking: Reported on 03/31/2022)     lisinopril (ZESTRIL) 5 MG tablet Take 5 mg by mouth daily.     meloxicam (MOBIC) 7.5 MG tablet Take 2 tablets by mouth daily.     metFORMIN (GLUCOPHAGE) 500 MG tablet Take by mouth 2 (two) times daily with a meal.     MOUNJARO 2.5 MG/0.5ML Pen Inject 2.5 mg into the skin once a week. Sunday     naproxen sodium (ALEVE) 220 MG tablet Take 660 mg by mouth.     No current facility-administered medications for this visit.     Past Surgical History:  Procedure Laterality Date   CESAREAN SECTION     CHOLECYSTECTOMY     KNEE SURGERY     20$ 17     No Known Allergies    Family History  Problem Relation Age of Onset   Hypertension Father    Diabetes Neg Hx      Social History Christina Estrada reports that she has quit smoking. Her smoking use included cigarettes. She smoked an average of .25 packs per day. She has never used smokeless tobacco. Christina Estrada reports current alcohol use.   Review of Systems CONSTITUTIONAL: No weight loss, fever, chills, weakness or fatigue.  HEENT:  Eyes: No visual loss, blurred vision, double vision or yellow sclerae.No hearing loss, sneezing, congestion, runny nose or sore throat.  SKIN: No rash or itching.  CARDIOVASCULAR: per hpi RESPIRATORY: No shortness of breath, cough or sputum.  GASTROINTESTINAL: No anorexia, nausea, vomiting or diarrhea. No abdominal pain or blood.  GENITOURINARY: No burning on urination, no polyuria NEUROLOGICAL: No headache, dizziness, syncope, paralysis, ataxia, numbness or tingling in the extremities. No change in bowel or bladder control.  MUSCULOSKELETAL: No muscle, back pain, joint pain or stiffness.  LYMPHATICS: No enlarged nodes. No history of splenectomy.  PSYCHIATRIC: No history of depression or anxiety.  ENDOCRINOLOGIC: No reports of sweating, cold or heat intolerance. No polyuria or polydipsia.  Marland Kitchen   Physical Examination Today's Vitals   05/17/22 0859  BP: 126/76  Pulse: 83  SpO2: 97%  Weight: (!) 306 lb (138.8 kg)  Height: 5' 7"$  (1.702 m)   Body mass index is 47.93 kg/m.  Gen: resting comfortably, no acute distress HEENT: no scleral icterus, pupils equal round and reactive, no palptable cervical adenopathy,  CV: RRR, no m/r/g no jvd Resp: Clear to auscultation bilaterally GI: abdomen is soft, non-tender, non-distended, normal bowel sounds, no hepatosplenomegaly MSK: extremities are warm, no edema.  Skin: warm, no rash  Neuro:  no focal deficits Psych: appropriate affect     Assessment and Plan  1.SVT - infrequent episodes, we will start lopressor 28m bid for now and monitor - if failure of beta blocker would refer to EP to consider ablation procedure       JArnoldo Lenis M.D.

## 2022-05-17 NOTE — Patient Instructions (Signed)
Medication Instructions:  START Lopressor 25 mg twice a day   Labwork: None today  Testing/Procedures: None today  Follow-Up: 6 months  Any Other Special Instructions Will Be Listed Below (If Applicable).  If you need a refill on your cardiac medications before your next appointment, please call your pharmacy.

## 2022-09-16 ENCOUNTER — Other Ambulatory Visit: Payer: Self-pay

## 2022-09-16 ENCOUNTER — Encounter (HOSPITAL_COMMUNITY): Payer: Self-pay

## 2022-09-16 ENCOUNTER — Emergency Department (HOSPITAL_COMMUNITY): Payer: 59

## 2022-09-16 ENCOUNTER — Emergency Department (HOSPITAL_COMMUNITY)
Admission: EM | Admit: 2022-09-16 | Discharge: 2022-09-17 | Disposition: A | Discharge status: Home / Self Care | Payer: 59 | Attending: Emergency Medicine | Admitting: Emergency Medicine

## 2022-09-16 DIAGNOSIS — E1165 Type 2 diabetes mellitus with hyperglycemia: Secondary | ICD-10-CM | POA: Diagnosis not present

## 2022-09-16 DIAGNOSIS — N3001 Acute cystitis with hematuria: Secondary | ICD-10-CM | POA: Diagnosis not present

## 2022-09-16 DIAGNOSIS — I10 Essential (primary) hypertension: Secondary | ICD-10-CM | POA: Insufficient documentation

## 2022-09-16 DIAGNOSIS — R1030 Lower abdominal pain, unspecified: Secondary | ICD-10-CM | POA: Diagnosis present

## 2022-09-16 DIAGNOSIS — E119 Type 2 diabetes mellitus without complications: Secondary | ICD-10-CM | POA: Insufficient documentation

## 2022-09-16 LAB — COMPREHENSIVE METABOLIC PANEL
ALT: 29 U/L (ref 0–44)
AST: 17 U/L (ref 15–41)
Albumin: 3.7 g/dL (ref 3.5–5.0)
Alkaline Phosphatase: 91 U/L (ref 38–126)
Anion gap: 8 (ref 5–15)
BUN: 12 mg/dL (ref 6–20)
CO2: 27 mmol/L (ref 22–32)
Calcium: 9.2 mg/dL (ref 8.9–10.3)
Chloride: 102 mmol/L (ref 98–111)
Creatinine, Ser: 0.67 mg/dL (ref 0.44–1.00)
GFR, Estimated: >60 mL/min (ref >60)
Glucose, Bld: 309 mg/dL — ABNORMAL HIGH (ref 70–99)
Potassium: 4.4 mmol/L (ref 3.5–5.1)
Sodium: 137 mmol/L (ref 135–145)
Total Bilirubin: 0.3 mg/dL (ref 0.3–1.2)
Total Protein: 7.2 g/dL (ref 6.5–8.1)

## 2022-09-16 LAB — LIPASE, BLOOD: Lipase: 48 U/L (ref 11–51)

## 2022-09-16 LAB — URINALYSIS, ROUTINE W REFLEX MICROSCOPIC
Bilirubin Urine: NEGATIVE
Glucose, UA: >=500 mg/dL — AB
Ketones, ur: NEGATIVE mg/dL
Nitrite: POSITIVE — AB
Protein, ur: NEGATIVE mg/dL
Specific Gravity, Urine: 1.021 (ref 1.005–1.030)
WBC, UA: >50 WBC/hpf (ref 0–5)
pH: 5 (ref 5.0–8.0)

## 2022-09-16 LAB — CBC
HCT: 42.8 % (ref 36.0–46.0)
Hemoglobin: 14.2 g/dL (ref 12.0–15.0)
MCH: 28.6 pg (ref 26.0–34.0)
MCHC: 33.2 g/dL (ref 30.0–36.0)
MCV: 86.3 fL (ref 80.0–100.0)
Platelets: 243 10*3/uL (ref 150–400)
RBC: 4.96 MIL/uL (ref 3.87–5.11)
RDW: 13.5 % (ref 11.5–15.5)
WBC: 15.5 10*3/uL — ABNORMAL HIGH (ref 4.0–10.5)
nRBC: 0 % (ref 0.0–0.2)

## 2022-09-16 LAB — POC URINE PREG, ED: Preg Test, Ur: NEGATIVE

## 2022-09-16 LAB — LACTIC ACID, PLASMA: Lactic Acid, Venous: 1.5 mmol/L (ref 0.5–1.9)

## 2022-09-16 MED ORDER — KETOROLAC TROMETHAMINE 15 MG/ML IJ SOLN
15.0000 mg | Freq: Once | INTRAMUSCULAR | Status: AC
  Administered 2022-09-16: 15 mg via INTRAVENOUS
  Filled 2022-09-16: qty 1

## 2022-09-16 MED ORDER — SODIUM CHLORIDE 0.9 % IV SOLN
2.0000 g | Freq: Once | INTRAVENOUS | Status: AC
  Administered 2022-09-16: 2 g via INTRAVENOUS
  Filled 2022-09-16: qty 20

## 2022-09-16 MED ORDER — PHENAZOPYRIDINE HCL 100 MG PO TABS
100.0000 mg | ORAL_TABLET | Freq: Once | ORAL | Status: AC
  Administered 2022-09-16: 100 mg via ORAL
  Filled 2022-09-16: qty 1

## 2022-09-16 MED ORDER — IOHEXOL 300 MG/ML  SOLN
100.0000 mL | Freq: Once | INTRAMUSCULAR | Status: AC | PRN
  Administered 2022-09-16: 100 mL via INTRAVENOUS

## 2022-09-16 MED ORDER — LACTATED RINGERS IV BOLUS
1000.0000 mL | Freq: Once | INTRAVENOUS | Status: AC
  Administered 2022-09-16: 1000 mL via INTRAVENOUS

## 2022-09-16 NOTE — ED Notes (Signed)
Pt is resting, she reports that she is tired but pain has decreased.  Family is at the bedside

## 2022-09-16 NOTE — ED Provider Triage Note (Signed)
Emergency Medicine Provider Triage Evaluation Note  Christina Estrada , a 52 y.o. female  was evaluated in triage.  Pt complains of left flank pan and lower abdominal pain since this AM. She reports that she was recently treated for a UTI and doesn't have any symptoms from that. Denies any fever, nausea, vomiting, diarrhea, constipation, melena or hematochezia. Denies h/o kidney stones.  Review of Systems  Positive:  Negative:   Physical Exam  BP (!) 142/86 (BP Location: Right Arm)   Pulse 82   Temp 98.2 F (36.8 C) (Oral)   Resp 16   Ht 5\' 7"  (1.702 m)   Wt (!) 138 kg   SpO2 100%   BMI 47.65 kg/m  Gen:   Awake, no distress   Resp:  Normal effort  MSK:   Moves extremities without difficulty  Other:  Some lower abdominal tendernes. Soft without guarding or rebound. No CVA tenderness  Medical Decision Making  Medically screening exam initiated at 3:32 PM.  Appropriate orders placed.  MENDY ALPERIN was informed that the remainder of the evaluation will be completed by another provider, this initial triage assessment does not replace that evaluation, and the importance of remaining in the ED until their evaluation is complete.  Will order renal CT. Likely UTI symptoms given urine. R/o Isaias Cowman, PA-C 09/16/22 1536

## 2022-09-16 NOTE — ED Triage Notes (Signed)
Pt comes in with complaints of lt flank pain that radiates to lt lower quadrant. Pt states seen before for same pain and was told she had fluid around her appendix.

## 2022-09-17 MED ORDER — CEFDINIR 300 MG PO CAPS: 300.0000 mg | ORAL_CAPSULE | Freq: Two times a day (BID) | ORAL | 0 refills | Status: DC

## 2022-09-17 NOTE — Discharge Instructions (Addendum)
You were seen in the ER today for evaluation of your abdominal pain.  Your CT scan does not show any acute abnormalities.  I likely think you are experiencing pain from a urinary tract infection.  I am placing you on a medication for you to take twice daily for the next 10 days to help with your symptoms.  I have also cultured your urine, please make sure to follow-up with your MyChart for results of this.  I would like for you to follow-up with your primary care doctor within the next few days as well for reevaluation given your recurrent UTIs.  I have also put in information for urologist for you to call to follow-up with.  If you have any concerns, new or worsening symptoms, please return to the nearest emergency department for evaluation.  Contact a doctor if: You do not get better after 1-2 days. Your symptoms go away and then come back. Get help right away if: You have very bad back pain. You have very bad pain in your lower belly. You have a fever. You have chills. You feeling like you will vomit or you vomit.

## 2022-09-17 NOTE — ED Provider Notes (Signed)
EMERGENCY DEPARTMENT AT [facility] Provider Note   CSN: [id number] Arrival date & time: 09/16/22  1236     History Chief Complaint  Patient presents with   Abdominal Pain    Christina Estrada is a 52 y.o. female with h/o HTN, SVT, DM presents to the ER for evaluation of lower abdominal pain and left flank pain since this morning. She reports that she has had similar pain to this and she had "fluid around her appendix" last year. She denies any dysuria, hematuria, fever, chills, nausea, vomiting, vaginal discharge, vaginal bleeding, melena, hematochezia, chest pain, or shortness of breath.  She reports that she was recently treated for a UTI last month.  She does not remember the name of the medication but thinks it was a black and yellow pill. NKDA. Denies any tobacco, EtOH, or illicit drug use.    Abdominal Pain Associated symptoms: no chest pain, no chills, no constipation, no diarrhea, no dysuria, no fever, no hematuria, no nausea, no shortness of breath, no vaginal bleeding, no vaginal discharge and no vomiting        Home Medications Prior to Admission medications   Medication Sig Start Date End Date Taking? Authorizing Provider  ibuprofen (ADVIL,MOTRIN) 200 MG tablet Take 600-800 mg by mouth every 6 (six) hours as needed for mild pain or moderate pain.    [provider]  JARDIANCE 25 MG TABS tablet Take 1 tablet by mouth daily. Patient not taking: Reported on 03/31/2022 11/30/21   [provider]  lisinopril (ZESTRIL) 5 MG tablet Take 5 mg by mouth daily. 08/01/21   [provider]  meloxicam (MOBIC) 7.5 MG tablet Take 2 tablets by mouth daily. 01/02/22   [provider]  metFORMIN (GLUCOPHAGE) 500 MG tablet Take by mouth 2 (two) times daily with a meal.    [provider]  metoprolol tartrate (LOPRESSOR) 25 MG tablet Take 1 tablet (25 mg total) by mouth 2 (two) times daily. 05/17/22   Antoine Poche, MD  MOUNJARO  2.5 MG/0.5ML Pen Inject 2.5 mg into the skin once a week. Sunday 03/30/22   [provider]  naproxen sodium (ALEVE) 220 MG tablet Take 660 mg by mouth.    [provider]      Allergies    Patient has no known allergies.    Review of Systems   Review of Systems  Constitutional:  Negative for chills and fever.  Respiratory:  Negative for shortness of breath.   Cardiovascular:  Negative for chest pain.  Gastrointestinal:  Positive for abdominal pain. Negative for constipation, diarrhea, nausea and vomiting.  Genitourinary:  Positive for flank pain. Negative for dysuria, hematuria, vaginal bleeding and vaginal discharge.    Physical Exam Updated Vital Signs BP 125/80   Pulse (!) 110   Temp 98.6 F (37 C) (Oral)   Resp 16   Ht 5\' 7"  (1.702 m)   Wt (!) 138 kg   SpO2 95%   BMI 47.65 kg/m  Physical Exam Vitals and nursing note reviewed.  Constitutional:      General: She is not in acute distress.    Appearance: She is not ill-appearing or toxic-appearing.  HENT:     Mouth/Throat:     Mouth: Mucous membranes are moist.  Cardiovascular:     Rate and Rhythm: Normal rate.  Pulmonary:     Effort: Pulmonary effort is normal. No respiratory distress.  Abdominal:     General: Bowel sounds are normal.  There is no distension.     Palpations: Abdomen is soft.     Tenderness: There is abdominal tenderness in the right lower quadrant, suprapubic area and left lower quadrant. There is no right CVA tenderness, left CVA tenderness, guarding or rebound.     Comments: Abdominal exam is limited 2/2 body habitus  Skin:    General: Skin is warm and dry.  Neurological:     Mental Status: She is alert.     ED Results / Procedures / Treatments   Labs (all labs ordered are listed, but only abnormal results are displayed) Labs Reviewed  COMPREHENSIVE METABOLIC PANEL - Abnormal; Notable for the following components:      Result Value   Glucose, Bld 309 (*)    All other  components within normal limits  CBC - Abnormal; Notable for the following components:   WBC 15.5 (*)    All other components within normal limits  URINALYSIS, ROUTINE W REFLEX MICROSCOPIC - Abnormal; Notable for the following components:   APPearance HAZY (*)    Glucose, UA >=500 (*)    Hgb urine dipstick SMALL (*)    Nitrite POSITIVE (*)    Leukocytes,Ua MODERATE (*)    Bacteria, UA MANY (*)    All other components within normal limits  LIPASE, BLOOD  LACTIC ACID, PLASMA  LACTIC ACID, PLASMA  POC URINE PREG, ED    EKG None  Radiology CT ABDOMEN PELVIS W CONTRAST  Result Date: 09/16/2022 CLINICAL DATA:  Abdominal pain. EXAM: CT ABDOMEN AND PELVIS WITH CONTRAST TECHNIQUE: Multidetector CT imaging of the abdomen and pelvis was performed using the standard protocol following bolus administration of intravenous contrast. RADIATION DOSE REDUCTION: This exam was performed according to the departmental dose-optimization program which includes automated exposure control, adjustment of the mA and/or kV according to patient size and/or use of iterative reconstruction technique. CONTRAST:  OMNIPAQUE IOHEXOL 300 MG/ML  SOLN COMPARISON:  CT abdomen pelvis dated 08/20/2021. FINDINGS: Lower chest: The visualized lung bases are clear. No intra-abdominal free air or free fluid. Hepatobiliary: Fatty liver. No biliary dilatation. Cholecystectomy. Pancreas: Unremarkable. No pancreatic ductal dilatation or surrounding inflammatory changes. Spleen: Normal in size without focal abnormality. Adrenals/Urinary Tract: The adrenal glands are unremarkable. There is no hydronephrosis on either side. There is symmetric enhancement and excretion of contrast by both kidneys. The visualized ureters and the urinary bladder appear unremarkable. Stomach/Bowel: Small scattered distal colonic diverticula without active inflammatory changes. There is no bowel obstruction or active inflammation. The appendix is normal.  Vascular/Lymphatic: Mild aortoiliac atherosclerotic disease. The IVC is unremarkable. No portal venous gas. There is no adenopathy. Reproductive: The uterus is anteverted and grossly unremarkable. There is a 3 cm right ovarian dominant follicle or corpus luteum. No imaging follow-up. The left ovary is unremarkable. Other: Small fat containing umbilical hernia. Musculoskeletal: Degenerative changes of the spine. No acute osseous pathology. IMPRESSION: 1. No acute intra-abdominal or pelvic pathology. 2. Fatty liver. 3. Colonic diverticulosis. No bowel obstruction. Normal appendix. 4.  Aortic Atherosclerosis (ICD10-I70.0). Electronically Signed   By: Elgie Collard M.D.   On: 09/16/2022 19:11    Procedures Procedures   Medications Ordered in ED Medications  cefTRIAXone (ROCEPHIN) 2 g in sodium chloride 0.9 % 100 mL IVPB (2 g Intravenous New Bag/Given 09/16/22 2332)  iohexol (OMNIPAQUE) 300 MG/ML solution 100 mL (100 mLs Intravenous Contrast Given 09/16/22 1834)  ketorolac (TORADOL) 15 MG/ML injection 15 mg (15 mg Intravenous Given 09/16/22 2146)  lactated ringers bolus 1,000 mL (  1,000 mLs Intravenous New Bag/Given 09/16/22 2145)  phenazopyridine (PYRIDIUM) tablet 100 mg (100 mg Oral Given 09/16/22 2143)    ED Course/ Medical Decision Making/ A&P                            Medical Decision Making Amount and/or Complexity of Data Reviewed Labs: ordered. Radiology: ordered.  Risk Prescription drug management.   52 y.o. female presents to the ER for evaluation of lower abdominal pain since this morning. Differential diagnosis includes but is not limited to AAA, mesenteric ischemia, appendicitis, diverticulitis, DKA, gastroenteritis, nephrolithiasis, pancreatitis, constipation, UTI, bowel obstruction, biliary disease, IBD, PUD, hepatitis, ectopic pregnancy, ovarian torsion, PID. Vital signs shows some tachycardia, otherwise unremarkable. Physical exam as noted above.   Labs ordered. CT ordered.    On previous chart evaluation, I see that the patient was seen in May 2023 and had to have repeat same day CT imaging given small amount of gas seen near the appendix. She was sent in for repeat scan that was reassuring. During this event, they deemed her pain from a UTI. I discussed this with the patient.   I independently reviewed and interpreted the patient's labs.  CBC does show elevated white blood cell count of 15.5.  No anemia.  Lipase of 48.  CMP does show elevated glucose at 309 otherwise no electrolyte or LFT abnormalities.  Urinalysis is negative for UTI.  His urine with glucose present.  There is small amount of hemoglobin present as well as nitrite and leukocyte positive.  Many bacteria with greater than 50 white blood cells and 6-10 red blood cells present.  White blood cells present as well.  Lactic acid within normal limits.  POC pregnancy negative.  Urine culture pending.  CT shows 1. No acute intra-abdominal or pelvic pathology. 2. Fatty liver. 3. Colonic diverticulosis. No bowel obstruction. Normal appendix. 4.  Aortic Atherosclerosis 3cm ovarian cyst seen on this image as well as the one in 2023, similar in size.   Patient is likely having this pain from urinary tract infection.  She was given Toradol and Pyridium as well as some fluids.  Started her on Rocephin as well.  On reevaluation, patient reports that she is feeling better, tachycardia has improved.  Patient does have some underlying tachycardia baseline.  Blood pressure appears to be around her baseline as well.    She does have a white count, most likely due to her urinary tract infection.  Her abdomen is soft and nonperitoneal.  Her CT imaging is grossly unremarkable.  Evaluating for this pain is from a urinary tract infection.  She reports that she had pain similar to this prior in the above-mentioned episode and was diagnosed with a UTI then as well.  She overall is hemodynamically stable.  Will discharge home with  [facility] for coverage.  Previous culture shows that was resistant to Macrobid.  We discussed the results of the labs/imaging. The plan is take antibiotic as prescribed and follow-up with primary care doctor due to elevated glucose levels as her diabetes may not be well-controlled. We discussed strict return precautions and red flag symptoms. The patient verbalized their understanding and agrees to the plan. The patient is stable and being discharged home in good condition.  Portions of this report may have been transcribed using voice recognition software. Every effort was made to ensure accuracy; however, inadvertent computerized transcription errors may be present.   Final Clinical Impression(s) / ED  Diagnoses Final diagnoses:  Acute cystitis with hematuria  Hyperglycemia due to diabetes mellitus [facility])    Rx / DC Orders ED Discharge Orders          Ordered    cefdinir (OMNICEF) 300 MG capsule  2 times daily        09/17/22 0002              Achille Rich, PA-C 09/19/22 1519    Bethann Berkshire, MD 09/21/22 1213

## 2022-09-19 LAB — URINE CULTURE: Culture: >=100000 — AB

## 2022-09-20 ENCOUNTER — Telehealth (HOSPITAL_BASED_OUTPATIENT_CLINIC_OR_DEPARTMENT_OTHER): Payer: Self-pay | Admitting: *Deleted

## 2022-09-20 NOTE — Telephone Encounter (Signed)
Post ED Visit - Positive Culture Follow-up  Culture report reviewed by antimicrobial stewardship pharmacist: [facility] [x]  Andreas Ohm, Pharm.D. []  Celedonio Miyamoto, Pharm.D., BCPS AQ-ID []  Garvin Fila, Pharm.D., BCPS []  Georgina Pillion, Pharm.D., BCPS []  [place], [address].D., BCPS, AAHIVP []  Estella Husk, Pharm.D., BCPS, AAHIVP []  Lysle Pearl, PharmD, BCPS []  Phillips Climes, PharmD, BCPS []  Agapito Games, PharmD, BCPS []  Verlan Friends, PharmD []  Mervyn Gay, PharmD, BCPS []  Vinnie Level, PharmD  [facility] []  Len Childs, PharmD []  Greer Pickerel, PharmD []  Adalberto Cole, PharmD []  Perlie Gold, Rph []  Lonell Face) Jean Rosenthal, PharmD []  Earl Many, PharmD []  Junita Push, PharmD []  Dorna Leitz, PharmD []  Terrilee Files, PharmD []  Lynann Beaver, PharmD []  Keturah Barre, PharmD []  Loralee Pacas, PharmD []  Bernadene Person, PharmD   Positive urine culture Treated with Cefdinir, organism sensitive to the same and no further patient follow-up is required at this time.  Virl Axe [facility] 09/20/2022, 11:14 AM

## 2022-12-13 ENCOUNTER — Ambulatory Visit: Payer: 59 | Admitting: Cardiology

## 2023-01-10 ENCOUNTER — Emergency Department (HOSPITAL_COMMUNITY): Payer: 59

## 2023-01-10 ENCOUNTER — Emergency Department (HOSPITAL_COMMUNITY)
Admission: EM | Admit: 2023-01-10 | Discharge: 2023-01-10 | Disposition: A | Discharge status: Home / Self Care | Payer: 59 | Attending: Student | Admitting: Student

## 2023-01-10 ENCOUNTER — Other Ambulatory Visit: Payer: Self-pay

## 2023-01-10 ENCOUNTER — Encounter (HOSPITAL_COMMUNITY): Payer: Self-pay | Admitting: *Deleted

## 2023-01-10 DIAGNOSIS — R1032 Left lower quadrant pain: Secondary | ICD-10-CM | POA: Diagnosis present

## 2023-01-10 DIAGNOSIS — E119 Type 2 diabetes mellitus without complications: Secondary | ICD-10-CM | POA: Diagnosis not present

## 2023-01-10 DIAGNOSIS — N3001 Acute cystitis with hematuria: Secondary | ICD-10-CM | POA: Insufficient documentation

## 2023-01-10 DIAGNOSIS — Z7984 Long term (current) use of oral hypoglycemic drugs: Secondary | ICD-10-CM | POA: Insufficient documentation

## 2023-01-10 DIAGNOSIS — Z794 Long term (current) use of insulin: Secondary | ICD-10-CM | POA: Diagnosis not present

## 2023-01-10 HISTORY — DX: Supraventricular tachycardia, unspecified: I47.10

## 2023-01-10 LAB — CBC WITH DIFFERENTIAL/PLATELET
Abs Immature Granulocytes: 0.06 10*3/uL (ref 0.00–0.07)
Basophils Absolute: 0 10*3/uL (ref 0.0–0.1)
Basophils Relative: 0 %
Eosinophils Absolute: 0.1 10*3/uL (ref 0.0–0.5)
Eosinophils Relative: 1 %
HCT: 44.3 % (ref 36.0–46.0)
Hemoglobin: 14.4 g/dL (ref 12.0–15.0)
Immature Granulocytes: 0 %
Lymphocytes Relative: 10 %
Lymphs Abs: 1.4 10*3/uL (ref 0.7–4.0)
MCH: 28.3 pg (ref 26.0–34.0)
MCHC: 32.5 g/dL (ref 30.0–36.0)
MCV: 87 fL (ref 80.0–100.0)
Monocytes Absolute: 0.7 10*3/uL (ref 0.1–1.0)
Monocytes Relative: 5 %
Neutro Abs: 11.5 10*3/uL — ABNORMAL HIGH (ref 1.7–7.7)
Neutrophils Relative %: 84 %
Platelets: 256 10*3/uL (ref 150–400)
RBC: 5.09 MIL/uL (ref 3.87–5.11)
RDW: 13.3 % (ref 11.5–15.5)
WBC: 13.8 10*3/uL — ABNORMAL HIGH (ref 4.0–10.5)
nRBC: 0 % (ref 0.0–0.2)

## 2023-01-10 LAB — COMPREHENSIVE METABOLIC PANEL
ALT: 28 U/L (ref 0–44)
AST: 17 U/L (ref 15–41)
Albumin: 3.6 g/dL (ref 3.5–5.0)
Alkaline Phosphatase: 92 U/L (ref 38–126)
Anion gap: 8 (ref 5–15)
BUN: 13 mg/dL (ref 6–20)
CO2: 25 mmol/L (ref 22–32)
Calcium: 8.5 mg/dL — ABNORMAL LOW (ref 8.9–10.3)
Chloride: 101 mmol/L (ref 98–111)
Creatinine, Ser: 0.65 mg/dL (ref 0.44–1.00)
GFR, Estimated: >60 mL/min (ref >60)
Glucose, Bld: 256 mg/dL — ABNORMAL HIGH (ref 70–99)
Potassium: 3.9 mmol/L (ref 3.5–5.1)
Sodium: 134 mmol/L — ABNORMAL LOW (ref 135–145)
Total Bilirubin: 0.8 mg/dL (ref 0.3–1.2)
Total Protein: 6.7 g/dL (ref 6.5–8.1)

## 2023-01-10 LAB — URINALYSIS, ROUTINE W REFLEX MICROSCOPIC
Bilirubin Urine: NEGATIVE
Glucose, UA: >=500 mg/dL — AB
Ketones, ur: NEGATIVE mg/dL
Nitrite: POSITIVE — AB
Protein, ur: NEGATIVE mg/dL
Specific Gravity, Urine: >1.03 — ABNORMAL HIGH (ref 1.005–1.030)
pH: 6 (ref 5.0–8.0)

## 2023-01-10 LAB — URINALYSIS, MICROSCOPIC (REFLEX)

## 2023-01-10 LAB — LIPASE, BLOOD: Lipase: 42 U/L (ref 11–51)

## 2023-01-10 MED ORDER — ONDANSETRON HCL 4 MG/2ML IJ SOLN
4.0000 mg | Freq: Once | INTRAMUSCULAR | Status: AC
  Administered 2023-01-10: 4 mg via INTRAVENOUS
  Filled 2023-01-10: qty 2

## 2023-01-10 MED ORDER — CEFDINIR 300 MG PO CAPS
300.0000 mg | ORAL_CAPSULE | Freq: Two times a day (BID) | ORAL | 0 refills | Status: DC
Start: 2023-01-10 — End: 2023-01-10

## 2023-01-10 MED ORDER — KETOROLAC TROMETHAMINE 15 MG/ML IJ SOLN
15.0000 mg | Freq: Once | INTRAMUSCULAR | Status: AC
  Administered 2023-01-10: 15 mg via INTRAVENOUS
  Filled 2023-01-10: qty 1

## 2023-01-10 MED ORDER — SODIUM CHLORIDE 0.9 % IV SOLN
1.0000 g | Freq: Once | INTRAVENOUS | Status: AC
  Administered 2023-01-10: 1 g via INTRAVENOUS
  Filled 2023-01-10: qty 10

## 2023-01-10 MED ORDER — CEFPODOXIME PROXETIL 100 MG PO TABS: 100.0000 mg | ORAL_TABLET | Freq: Two times a day (BID) | ORAL | 0 refills | Status: AC

## 2023-01-10 NOTE — ED Provider Notes (Signed)
[place] EMERGENCY DEPARTMENT AT [facility] Provider Note   CSN: [id number] Arrival date & time: 01/10/23  0815     History  Chief Complaint  Patient presents with   Groin Pain    Christina Estrada is a 52 y.o. female.  Of diabetes.  Presents to ER with left lower quadrant pain that started yesterday in her low back as in the left pelvic area.  States she has had pain like this about a year and a half ago and was told UTI at that time.  She denies dysuria frequency urgency however.  No longer to have mild nausea this morning.  No fevers or chills.   Groin Pain       Home Medications Prior to Admission medications   Medication Sig Start Date End Date Taking? Authorizing Provider  cefpodoxime (VANTIN) 100 MG tablet Take 1 tablet (100 mg total) by mouth 2 (two) times daily for 7 days. 01/10/23 01/17/23 Yes Goerge Mohr A, PA-C  ibuprofen (ADVIL,MOTRIN) 200 MG tablet Take 600-800 mg by mouth every 6 (six) hours as needed for mild pain or moderate pain.    [provider]  JARDIANCE 25 MG TABS tablet Take 1 tablet by mouth daily. Patient not taking: Reported on 03/31/2022 11/30/21   [provider]  lisinopril (ZESTRIL) 5 MG tablet Take 5 mg by mouth daily. 08/01/21   [provider]  meloxicam (MOBIC) 7.5 MG tablet Take 2 tablets by mouth daily. 01/02/22   [provider]  metFORMIN (GLUCOPHAGE) 500 MG tablet Take by mouth 2 (two) times daily with a meal.    [provider]  metoprolol tartrate (LOPRESSOR) 25 MG tablet Take 1 tablet (25 mg total) by mouth 2 (two) times daily. 05/17/22   Antoine Poche, MD  MOUNJARO 2.5 MG/0.5ML Pen Inject 2.5 mg into the skin once a week. Sunday 03/30/22   [provider]  naproxen sodium (ALEVE) 220 MG tablet Take 660 mg by mouth.    [provider]      Allergies    Patient has no known allergies.    Review of Systems   Review of Systems  Physical Exam Updated  Vital Signs BP (!) 147/90 (BP Location: Left Arm)   Pulse 79   Temp 97.7 F (36.5 C) (Oral)   Resp 18   Ht 5\' 7"  (1.702 m)   Wt 136.1 kg   LMP 08/13/2021   SpO2 95%   BMI 46.99 kg/m  Physical Exam Vitals and nursing note reviewed.  Constitutional:      General: She is not in acute distress.    Appearance: She is well-developed.  HENT:     Head: Normocephalic and atraumatic.     Mouth/Throat:     Mouth: Mucous membranes are moist.  Eyes:     Extraocular Movements: Extraocular movements intact.     Conjunctiva/sclera: Conjunctivae normal.     Pupils: Pupils are equal, round, and reactive to light.  Cardiovascular:     Rate and Rhythm: Normal rate and regular rhythm.     Heart sounds: No murmur heard. Pulmonary:     Effort: Pulmonary effort is normal. No respiratory distress.     Breath sounds: Normal breath sounds.  Abdominal:     Palpations: Abdomen is soft.     Tenderness: There is no abdominal tenderness.  Musculoskeletal:        General: No swelling. Normal range of motion.     Cervical back: Neck supple.  Skin:    General: Skin is warm and dry.     Capillary Refill: Capillary refill takes less than 2 seconds.  Neurological:     General: No focal deficit present.     Mental Status: She is alert and oriented to person, place, and time.  Psychiatric:        Mood and Affect: Mood normal.     ED Results / Procedures / Treatments   Labs (all labs ordered are listed, but only abnormal results are displayed) Labs Reviewed  CBC WITH DIFFERENTIAL/PLATELET - Abnormal; Notable for the following components:      Result Value   WBC 13.8 (*)    Neutro Abs 11.5 (*)    All other components within normal limits  COMPREHENSIVE METABOLIC PANEL - Abnormal; Notable for the following components:   Sodium 134 (*)    Glucose, Bld 256 (*)    Calcium 8.5 (*)    All other components within normal limits  URINALYSIS, ROUTINE W REFLEX MICROSCOPIC - Abnormal; Notable for the  following components:   Specific Gravity, Urine >1.030 (*)    Glucose, UA >=500 (*)    Hgb urine dipstick SMALL (*)    Nitrite POSITIVE (*)    Leukocytes,Ua TRACE (*)    All other components within normal limits  URINALYSIS, MICROSCOPIC (REFLEX) - Abnormal; Notable for the following components:   Bacteria, UA MANY (*)    All other components within normal limits  URINE CULTURE  LIPASE, BLOOD    EKG None  Radiology CT Renal Stone Study  Result Date: 01/10/2023 CLINICAL DATA:  Recent left flank pain, now left groin pain. EXAM: CT ABDOMEN AND PELVIS WITHOUT CONTRAST TECHNIQUE: Multidetector CT imaging of the abdomen and pelvis was performed following the standard protocol without IV contrast. RADIATION DOSE REDUCTION: This exam was performed according to the departmental dose-optimization program which includes automated exposure control, adjustment of the mA and/or kV according to patient size and/or use of iterative reconstruction technique. COMPARISON:  09/16/2022 FINDINGS: Lower chest: No acute abnormality Hepatobiliary: Prior cholecystectomy. Diffuse fatty infiltration of the liver. No focal hepatic abnormality. Pancreas: No focal abnormality or ductal dilatation. Spleen: No focal abnormality.  Normal size. Adrenals/Urinary Tract: No adrenal abnormality. No focal renal abnormality. No stones or hydronephrosis. Urinary bladder is unremarkable. Stomach/Bowel: Stomach, large and small bowel grossly unremarkable. Scattered colonic diverticula. No active diverticulitis. Vascular/Lymphatic: No evidence of aneurysm or adenopathy. Reproductive: Uterus and left adnexa unremarkable. 3.2 cm right ovarian cyst is stable since prior study, likely dominant follicle or corpus luteum. No imaging follow-up recommended. Other: Trace free fluid in the cul-de-sac. No free air. Small umbilical hernia containing fat. Musculoskeletal: No acute bony abnormality. IMPRESSION: 1. No acute findings 2. Hepatic steatosis  3. Scattered aortic atherosclerosis. 4. Scattered colonic diverticulosis. Electronically Signed   By: Charlett Nose M.D.   On: 01/10/2023 10:25    Procedures Procedures    Medications Ordered in ED Medications  cefTRIAXone (ROCEPHIN) 1 g in sodium chloride 0.9 % 100 mL IVPB (1 g Intravenous New Bag/Given 01/10/23 1038)  ketorolac (TORADOL) 15 MG/ML injection 15 mg (15 mg Intravenous Given 01/10/23 1024)  ondansetron (ZOFRAN) injection 4 mg (4 mg Intravenous Given 01/10/23 1024)    ED Course/ Medical Decision Making/ A&P                                 Medical Decision Making DDx: UTI, pyelonephritis, ureterolithiasis.  Diverticulitis, other  ED course: Patient with left flank pain since yesterday, has moved from low back to left pelvic area.  Not having classic UTI symptoms, labs ordered from triage and she has a leukocytosis of 13.8, normal renal and liver function and does have UTI, normal lipase.  Exam shows mild left lower quadrant tenderness with no rebound guarding rigidity.  Suspect UTI, though concerned of possible kidney stone with infection given description of colicky pain, migration of pain from back to the pelvis and will get CT to rule out stone at this time.  Urine culture sent.  Patient has leukocytosis but does not meet any other SIRS criteria and is well appearing.  Blood sugar slightly elevated but patient not in DKA   CT shows no stones, incidental findings of aortic atherosclerosis that is scattered and hepatic steatosis discussed with patient, to follow-up with her PCP regarding incidental findings.  Will treat her UTI with third-generation cephalosporin, given we will give cefpodoxime.  Prior urine culture show resistance to Macrobid.  Advised on PCP follow-up strict return precautions.   Amount and/or Complexity of Data Reviewed External Data Reviewed: labs and notes. Labs: ordered. Decision-making details documented in ED Course. Radiology: ordered and independent  interpretation performed.    Details: CT renal shows no ureteral stones or other obvious acute intra-abdominal process, interpreted by me within the scope of determining acute margin pathology.  I agree with radiology read           Final Clinical Impression(s) / ED Diagnoses Final diagnoses:  Acute cystitis with hematuria    Rx / DC Orders ED Discharge Orders          Ordered    cefdinir (OMNICEF) 300 MG capsule  2 times daily,   Status:  Discontinued        01/10/23 1034    cefpodoxime (VANTIN) 100 MG tablet  2 times daily        01/10/23 [address] PA-C 01/10/23 1042    Glendora Score, MD 01/10/23 2242

## 2023-01-10 NOTE — ED Triage Notes (Signed)
Pt c/o pain to left groin since she woke up at 0530 this morning along with 1 episode of slight nausea. Pt reports a few days ago she had pain to her left flank area, but that has now resolved. Denies fever and dysuria. Pt reports similar pain on Father's Day this year and was told she had a UTI.

## 2023-01-12 LAB — URINE CULTURE: Culture: >=100000 — AB

## 2023-01-13 ENCOUNTER — Telehealth (HOSPITAL_BASED_OUTPATIENT_CLINIC_OR_DEPARTMENT_OTHER): Payer: Self-pay | Admitting: *Deleted

## 2023-01-13 NOTE — Telephone Encounter (Signed)
Post ED Visit - Positive Culture Follow-up  Culture report reviewed by antimicrobial stewardship pharmacist: [facility] []  Enzo Bi, Pharm.D. []  Celedonio Miyamoto, Pharm.D., BCPS AQ-ID []  Garvin Fila, Pharm.D., BCPS []  Georgina Pillion, Pharm.D., BCPS []  [place], [place].D., BCPS, AAHIVP []  Estella Husk, Pharm.D., BCPS, AAHIVP []  Lysle Pearl, PharmD, BCPS []  Phillips Climes, PharmD, BCPS []  Agapito Games, PharmD, BCPS []  Verlan Friends, PharmD []  Mervyn Gay, PharmD, BCPS [x]  Gillis Ends, PharmD  [facility] []  Len Childs, PharmD []  Greer Pickerel, PharmD []  Adalberto Cole, PharmD []  Perlie Gold, Rph []  Lonell Face) Jean Rosenthal, PharmD []  Earl Many, PharmD []  Junita Push, PharmD []  Dorna Leitz, PharmD []  Terrilee Files, PharmD []  Lynann Beaver, PharmD []  Keturah Barre, PharmD []  Loralee Pacas, PharmD []  Bernadene Person, PharmD   Positive urine culture Treated with Cefdinir, organism sensitive to the same and no further patient follow-up is required at this time.  Patsey Berthold 01/13/2023, 8:12 AM

## 2023-02-03 IMAGING — CT CT ABD-PELV W/ CM
2 of 5 series · 13 of 46 positions shown, 15 images · IV contrast (agent unspecified)
Comparison: None Available.
COMPARISON: None Available.

Addendum:
CLINICAL DATA: 50-year-old female with RIGHT abdominal and pelvic
pain with nausea. Patient does not have a reported history of
appendectomy.

EXAM:
CT ABDOMEN AND PELVIS WITH CONTRAST
TECHNIQUE: Multidetector CT imaging of the abdomen and pelvis was performed
using the standard protocol following bolus administration of
intravenous contrast.

[Series 2: axial st · axial · 0.98mm/px · z∈[+734,+1204]mm · 10 of 106 slices shown, 12 images]
[im 6/106  soft-tissue]
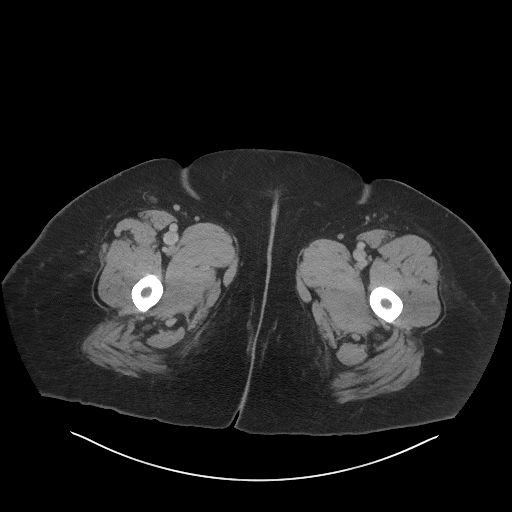
[im 6/106  bone]
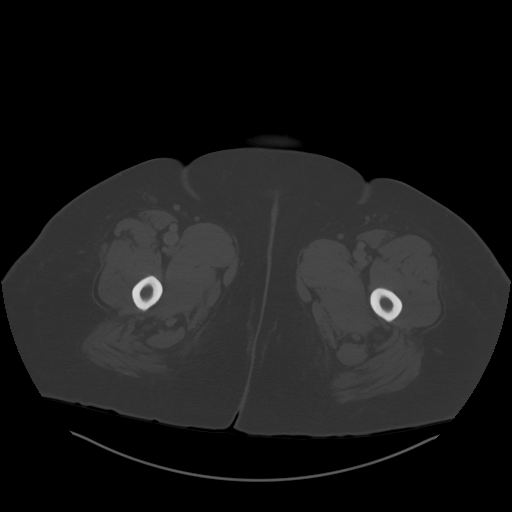
[im 18/106  soft-tissue]
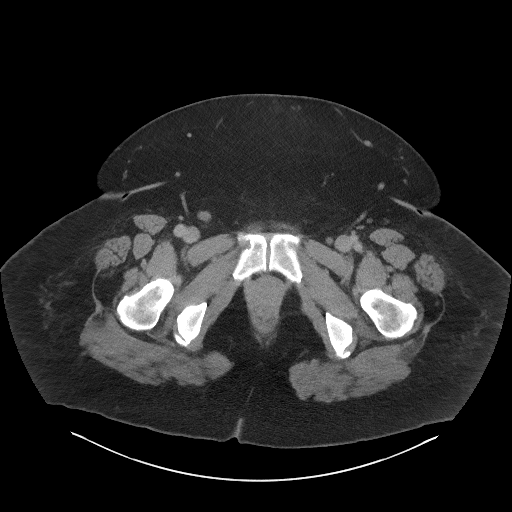
[im 30/106  soft-tissue]
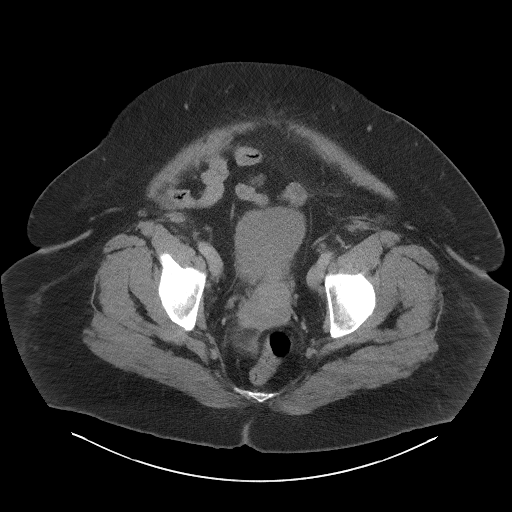
[im 36/106  soft-tissue]
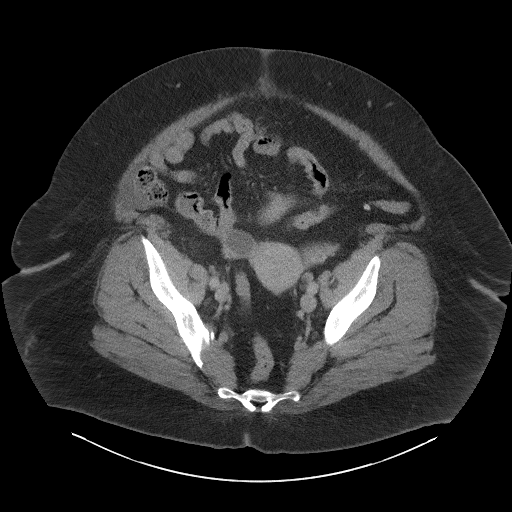
[im 47/106  soft-tissue]
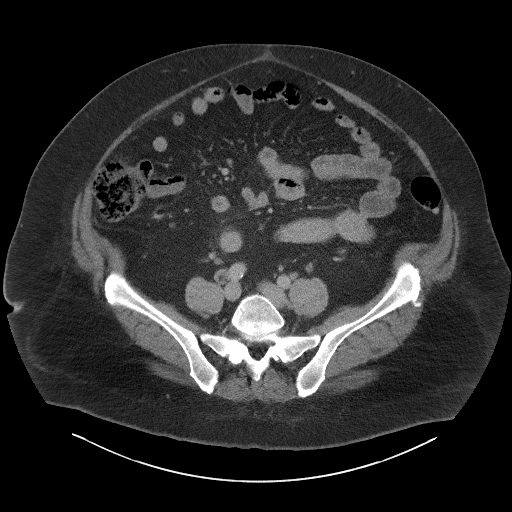
[im 59/106  soft-tissue]
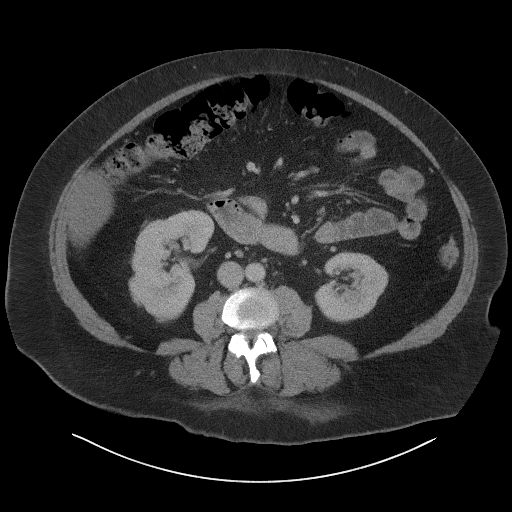
[im 71/106  soft-tissue]
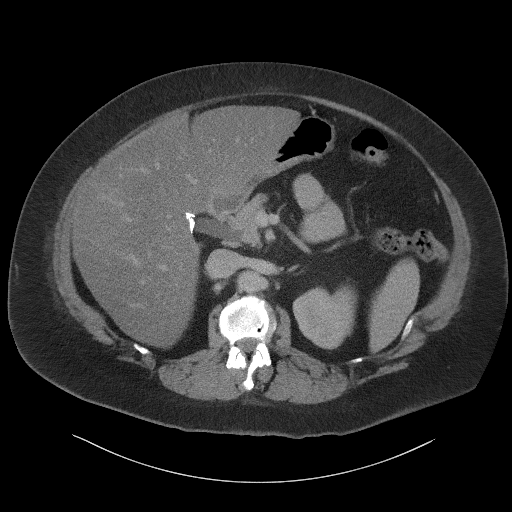
[im 76/106  soft-tissue]
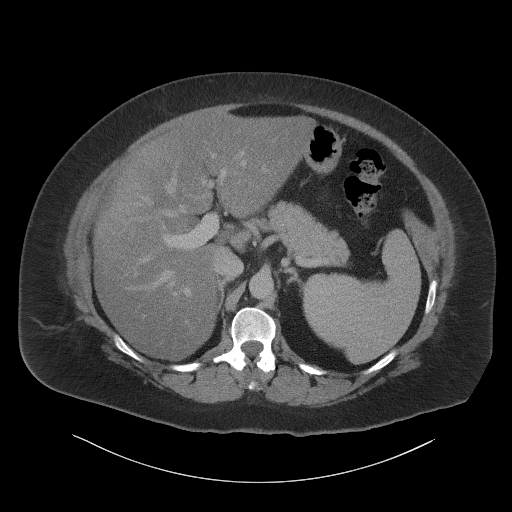
[im 88/106  soft-tissue]
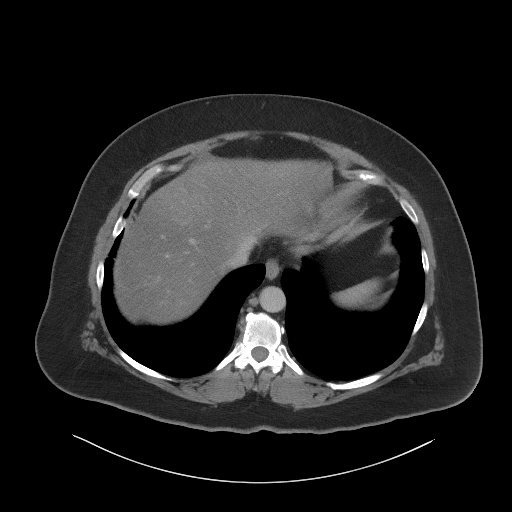
[im 88/106  bone]
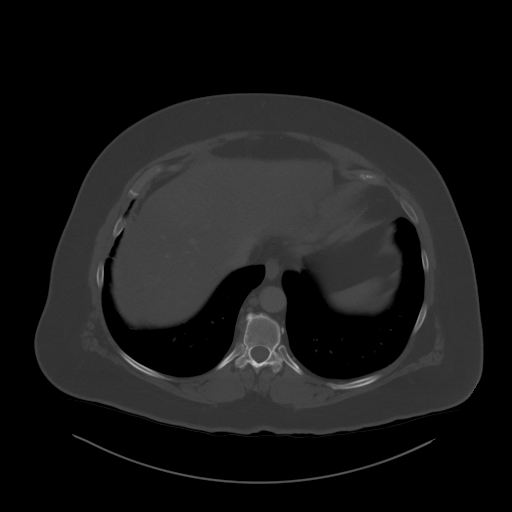
[im 100/106  soft-tissue]
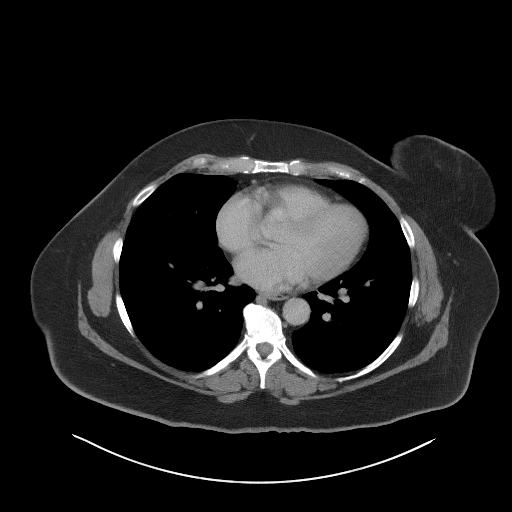

[Series 5: coronal st · coronal · 1.08mm/px · 3 of 146 slices shown]
[im 49/146  soft-tissue]
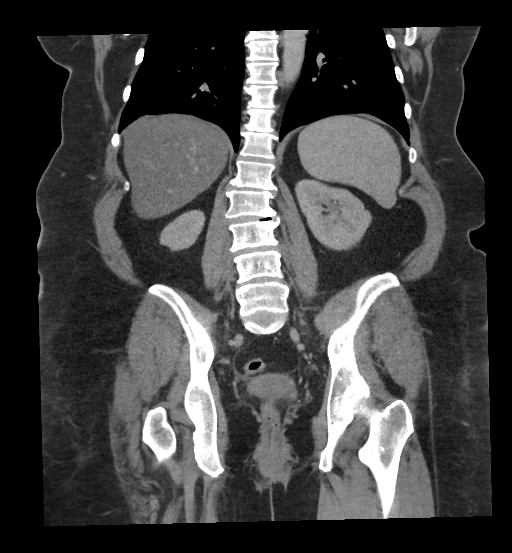
[im 65/146  soft-tissue]
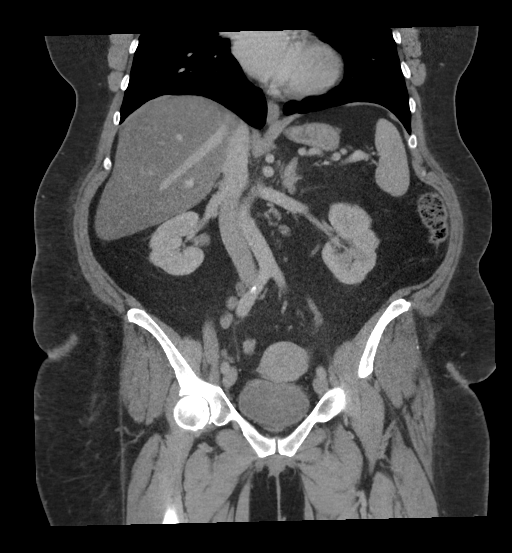
[im 81/146  soft-tissue]
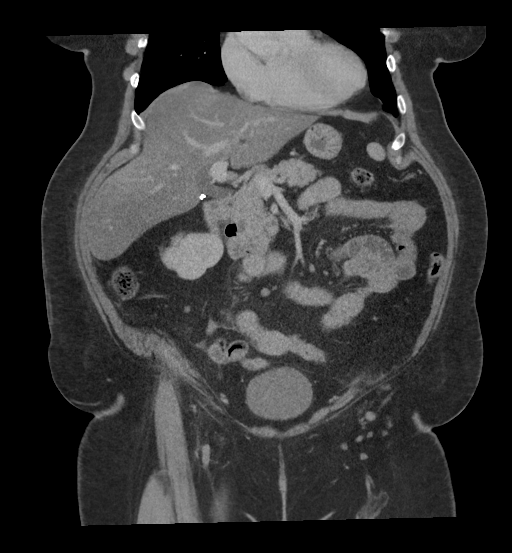

[13 of 46 positions shown; findings below may reference images not displayed]

RADIATION DOSE REDUCTION: This exam was performed according to the
departmental dose-optimization program which includes automated
exposure control, adjustment of the mA and/or kV according to
patient size and/or use of iterative reconstruction technique.

CONTRAST:  100mL OMNIPAQUE IOHEXOL 300 MG/ML  SOLN
FINDINGS: Lower chest: No acute abnormality.

Hepatobiliary: Hepatic steatosis is identified without suspicious
focal hepatic abnormality. The patient is status post
cholecystectomy. The CBD is dilated to the ampulla measuring up to
14 mm, without obstructing cause or intrahepatic or pancreatic
biliary dilatation.

Pancreas: Unremarkable

Spleen: UPPER limits normal spleen size noted.

Adrenals/Urinary Tract: The kidneys, adrenal glands and bladder are
unremarkable.

Stomach/Bowel: The appendix is not definitely identified. There is a
focus of gas adjacent to the cecal base (image 70: Series 2) which
could represent a small amount of gas in a normal appendix (which is
not well-defined) versus a tiny amount of pneumoperitoneum . There
is a small amount of nonspecific fluid adjacent to the cecal base
but without significant inflammatory changes. There is no evidence
of wall thickening of adjacent bowel/cecum in this area

There is no evidence of bowel obstruction or discrete abscess.

Vascular/Lymphatic: Aortic atherosclerosis. No enlarged abdominal or
pelvic lymph nodes.

Reproductive: A 2.5 x 3.1 cm RIGHT adnexal/ovarian cyst is present.
A trace amount of free pelvic fluid is noted. The uterus and LEFT
adnexal regions are unremarkable.

Other: A small umbilical hernia containing fat is noted.

Musculoskeletal: No acute or suspicious bony abnormalities are
noted. Mild degenerative disc disease in the lumbar spine
identified.
IMPRESSION: 1. The appendix is not definitely identified but there is a focus of
gas adjacent to the cecal base which could represent a small amount
of gas in a normal appendix versus a tiny amount of
pneumoperitoneum. Small amount of nonspecific fluid adjacent to the
cecal base is well but without significant inflammatory changes or
cecal wall thickening. No evidence of bowel obstruction or discrete
abscess. Repeat delayed CT scan with oral contrast may be helpful
for further evaluation of this area.
2. 2.5 x 3.1 cm RIGHT adnexal/ovarian cyst. Recommend follow-up US
in 6-12 months. Note: This recommendation does not apply to
premenarchal patients and to those with increased risk (genetic,
family history, elevated tumor markers or other high-risk factors)
of ovarian cancer. Reference: JACR [DATE]):[id number]
3. Hepatic steatosis.
4. Aortic Atherosclerosis (AO7FY-PSQ.Q).

Critical Value/emergent results were called by telephone at the time
of interpretation on 08/20/2021 at [DATE] to provider HARTWIG
STULL , who verbally acknowledged these results.

ADDENDUM:
IMPRESSION should include

Prominent CBD status post cholecystectomy, without intrahepatic or
pancreatic ductal dilatation and no definite obstructing cause.
Consider further evaluation with MRCP/ERCP.

*** End of Addendum ***
RADIATION DOSE REDUCTION: This exam was performed according to the
departmental dose-optimization program which includes automated
exposure control, adjustment of the mA and/or kV according to
patient size and/or use of iterative reconstruction technique.

CONTRAST:  100mL OMNIPAQUE IOHEXOL 300 MG/ML  SOLN
FINDINGS: Lower chest: No acute abnormality.

Hepatobiliary: Hepatic steatosis is identified without suspicious
focal hepatic abnormality. The patient is status post
cholecystectomy. The CBD is dilated to the ampulla measuring up to
14 mm, without obstructing cause or intrahepatic or pancreatic
biliary dilatation.

Pancreas: Unremarkable

Spleen: UPPER limits normal spleen size noted.

Adrenals/Urinary Tract: The kidneys, adrenal glands and bladder are
unremarkable.

Stomach/Bowel: The appendix is not definitely identified. There is a
focus of gas adjacent to the cecal base (image 70: Series 2) which
could represent a small amount of gas in a normal appendix (which is
not well-defined) versus a tiny amount of pneumoperitoneum . There
is a small amount of nonspecific fluid adjacent to the cecal base
but without significant inflammatory changes. There is no evidence
of wall thickening of adjacent bowel/cecum in this area

There is no evidence of bowel obstruction or discrete abscess.

Vascular/Lymphatic: Aortic atherosclerosis. No enlarged abdominal or
pelvic lymph nodes.

Reproductive: A 2.5 x 3.1 cm RIGHT adnexal/ovarian cyst is present.
A trace amount of free pelvic fluid is noted. The uterus and LEFT
adnexal regions are unremarkable.

Other: A small umbilical hernia containing fat is noted.

Musculoskeletal: No acute or suspicious bony abnormalities are
noted. Mild degenerative disc disease in the lumbar spine
identified.
IMPRESSION: 1. The appendix is not definitely identified but there is a focus of
gas adjacent to the cecal base which could represent a small amount
of gas in a normal appendix versus a tiny amount of
pneumoperitoneum. Small amount of nonspecific fluid adjacent to the
cecal base is well but without significant inflammatory changes or
cecal wall thickening. No evidence of bowel obstruction or discrete
abscess. Repeat delayed CT scan with oral contrast may be helpful
for further evaluation of this area.
2. 2.5 x 3.1 cm RIGHT adnexal/ovarian cyst. Recommend follow-up US
in 6-12 months. Note: This recommendation does not apply to
premenarchal patients and to those with increased risk (genetic,
family history, elevated tumor markers or other high-risk factors)
of ovarian cancer. Reference: JACR [DATE]):[id number]
3. Hepatic steatosis.
4. Aortic Atherosclerosis (AO7FY-PSQ.Q).

Critical Value/emergent results were called by telephone at the time
of interpretation on 08/20/2021 at [DATE] to provider HARTWIG
STULL , who verbally acknowledged these results.

## 2023-04-18 ENCOUNTER — Encounter: Payer: Self-pay | Admitting: Cardiology

## 2023-04-18 ENCOUNTER — Ambulatory Visit: Payer: 59 | Attending: Cardiology | Admitting: Cardiology

## 2023-04-18 ENCOUNTER — Encounter: Payer: Self-pay | Admitting: *Deleted

## 2023-04-18 VITALS — BP 106/74 | HR 82 | Ht 67.0 in | Wt 303.0 lb

## 2023-04-18 DIAGNOSIS — I471 Supraventricular tachycardia, unspecified: Secondary | ICD-10-CM

## 2023-04-18 NOTE — Patient Instructions (Addendum)

## 2023-04-18 NOTE — Progress Notes (Signed)
Clinical Summary Ms. Venne is a 53 y.o.female seen today for follow up of the following medical problems.   1.Palpitations/PSVT - ER visit 03/31/22 with palpitations - prior history of SVT with conversion with adenosine in ER 10/28/21 - about 2-3 episodes over the last several months   - rare coffee, tea every other day, rare sodas, no energy drinks. Rare etoh   - no recent palpitations.  - compliant with meds   2.OSA - on cpap  - followed by [facility]     Past Medical History:  Diagnosis Date   Diabetes mellitus without complication (HCC)    Sleep apnea    SVT (supraventricular tachycardia) (HCC)    Vaginal Pap smear, abnormal      No Known Allergies   Current Outpatient Medications  Medication Sig Dispense Refill   ibuprofen (ADVIL,MOTRIN) 200 MG tablet Take 600-800 mg by mouth every 6 (six) hours as needed for mild pain or moderate pain.     JARDIANCE 25 MG TABS tablet Take 1 tablet by mouth daily. (Patient not taking: Reported on 03/31/2022)     lisinopril (ZESTRIL) 5 MG tablet Take 5 mg by mouth daily.     meloxicam (MOBIC) 7.5 MG tablet Take 2 tablets by mouth daily.     metFORMIN (GLUCOPHAGE) 500 MG tablet Take by mouth 2 (two) times daily with a meal.     metoprolol tartrate (LOPRESSOR) 25 MG tablet Take 1 tablet (25 mg total) by mouth 2 (two) times daily. 180 tablet 3   MOUNJARO 2.5 MG/0.5ML Pen Inject 2.5 mg into the skin once a week. Sunday     naproxen sodium (ALEVE) 220 MG tablet Take 660 mg by mouth.     No current facility-administered medications for this visit.     Past Surgical History:  Procedure Laterality Date   CESAREAN SECTION     CHOLECYSTECTOMY     KNEE SURGERY     20 17     No Known Allergies    Family History  Problem Relation Age of Onset   Hypertension Father    Diabetes Neg Hx      Social History Ms. Mynhier reports that she has quit smoking. Her smoking use included cigarettes. She has never used smokeless  tobacco. Ms. Demerchant reports that she does not currently use alcohol.   Review of Systems CONSTITUTIONAL: No weight loss, fever, chills, weakness or fatigue.  HEENT: Eyes: No visual loss, blurred vision, double vision or yellow sclerae.No hearing loss, sneezing, congestion, runny nose or sore throat.  SKIN: No rash or itching.  CARDIOVASCULAR: per hpi RESPIRATORY: No shortness of breath, cough or sputum.  GASTROINTESTINAL: No anorexia, nausea, vomiting or diarrhea. No abdominal pain or blood.  GENITOURINARY: No burning on urination, no polyuria NEUROLOGICAL: No headache, dizziness, syncope, paralysis, ataxia, numbness or tingling in the extremities. No change in bowel or bladder control.  MUSCULOSKELETAL: No muscle, back pain, joint pain or stiffness.  LYMPHATICS: No enlarged nodes. No history of splenectomy.  PSYCHIATRIC: No history of depression or anxiety.  ENDOCRINOLOGIC: No reports of sweating, cold or heat intolerance. No polyuria or polydipsia.  Marland Kitchen   Physical Examination Today's Vitals   04/18/23 0820  BP: 106/74  Pulse: 82  SpO2: 97%  Weight: (!) 303 lb (137.4 kg)  Height: 5\' 7"  (1.702 m)   Body mass index is 47.46 kg/m.  Gen: resting comfortably, no acute distress HEENT: no scleral icterus, pupils equal round and reactive, no palptable cervical adenopathy,  CV: RRR, no mrg, no jvd Resp: Clear to auscultation bilaterally GI: abdomen is soft, non-tender, non-distended, normal bowel sounds, no hepatosplenomegaly MSK: extremities are warm, no edema.  Skin: warm, no rash Neuro:  no focal deficits Psych: appropriate affect    Assessment and Plan  1.SVT - doing well on lopressor without significant symptoms - continue current meds   F/u 6 months, if doing well could see annually     Antoine Poche, M.D.

## 2023-04-19 ENCOUNTER — Encounter: Payer: Self-pay | Admitting: Family Medicine

## 2023-08-11 ENCOUNTER — Other Ambulatory Visit: Payer: Self-pay | Admitting: Cardiology

## 2024-04-09 ENCOUNTER — Encounter: Payer: Self-pay | Admitting: Cardiology

## 2024-04-09 ENCOUNTER — Ambulatory Visit: Attending: Cardiology | Admitting: Cardiology

## 2024-04-09 VITALS — BP 118/90 | HR 81 | Ht 67.0 in | Wt 292.8 lb

## 2024-04-09 DIAGNOSIS — I471 Supraventricular tachycardia, unspecified: Secondary | ICD-10-CM | POA: Diagnosis not present

## 2024-04-09 DIAGNOSIS — G4730 Sleep apnea, unspecified: Secondary | ICD-10-CM

## 2024-04-09 NOTE — Progress Notes (Signed)
 "     Clinical Summary [name] is a 54 y.o.female seen today for follow up of the following medical problems.    1.Palpitations/PSVT - captured Oct 28 2021 EKG - ER visit 03/31/22 with palpitations - prior history of SVT with conversion with adenosine  in ER 10/28/21 - about 2-3 episodes over the last several months   - rare coffee, tea every other day, rare sodas, no energy drinks. Rare etoh   - no recent palpitations.  - compliant with meds  01/2024 ER visit to [facility] with SVT - from EMS HRs 180s, self converted to SR prior to arrival to ER - since then very mild fluttering   2.OSA - off cpap, stopped using - needs to reestablish with sleep medicine. Previously was seeing Dr Buck, would like to see someone local   Past Medical History:  Diagnosis Date   Diabetes mellitus without complication (HCC)    Sleep apnea    SVT (supraventricular tachycardia)    Vaginal Pap smear, abnormal      Allergies[1]   Current Outpatient Medications  Medication Sig Dispense Refill   ibuprofen (ADVIL,MOTRIN) 200 MG tablet Take 600-800 mg by mouth every 6 (six) hours as needed for mild pain or moderate pain.     JARDIANCE 25 MG TABS tablet Take 1 tablet by mouth daily. (Patient not taking: Reported on 03/31/2022)     lisinopril (ZESTRIL) 5 MG tablet Take 5 mg by mouth daily.     meloxicam (MOBIC) 7.5 MG tablet Take 2 tablets by mouth daily. (Patient not taking: Reported on 04/18/2023)     metFORMIN (GLUCOPHAGE) 500 MG tablet Take by mouth 2 (two) times daily with a meal.     metoprolol  tartrate (LOPRESSOR ) 25 MG tablet Take 1 tablet by mouth twice daily 180 tablet 3   MOUNJARO 2.5 MG/0.5ML Pen Inject 2.5 mg into the skin once a week. Sunday (Patient not taking: Reported on 04/18/2023)     naproxen sodium (ALEVE) 220 MG tablet Take 660 mg by mouth.     No current facility-administered medications for this visit.     Past Surgical History:  Procedure Laterality Date   CESAREAN SECTION      CHOLECYSTECTOMY     KNEE SURGERY     20 15     Allergies[2]    Family History  Problem Relation Age of Onset   Hypertension Father    Diabetes Neg Hx      Social History [name] reports that she has quit smoking. Her smoking use included cigarettes. She has never used smokeless tobacco. [name] reports that she does not currently use alcohol.    Physical Examination Today's Vitals   04/09/24 1343  BP: (!) 118/90  Pulse: 81  SpO2: 96%  Weight: 292 lb 12.8 oz (132.8 kg)  Height: 5' 7 (1.702 m)   Body mass index is 45.86 kg/m.  Gen: resting comfortably, no acute distress HEENT: no scleral icterus, pupils equal round and reactive, no palptable cervical adenopathy,  CV: RRR, no m/rg, no jvd Resp: Clear to auscultation bilaterally GI: abdomen is soft, non-tender, non-distended, normal bowel sounds, no hepatosplenomegaly MSK: extremities are warm, no edema.  Skin: warm, no rash Neuro:  no focal deficits Psych: appropriate affect   Diagnostic Studies     Assessment and Plan  1.SVT - overall infrequent symptoms, continue current medical therapy. Discussed vagal maneuvers, can take additional 25mg  prn of lopressor  - room to titrate lopressor  if needed over time, if severe frequent  symptoms would consider EP referral to consider ablation - EKG today shows NSR  2. OSA - refer to sleep medicine at [facility] - previously followed by Dr Buck. She has stopped using her cpap, willing to resume and reestablish with sleep provider   [name], M.D.,     [1] No Known Allergies [2] No Known Allergies  "

## 2024-04-09 NOTE — Patient Instructions (Signed)
 Medication Instructions:  Your physician recommends that you continue on your current medications as directed. Please refer to the Current Medication list given to you today.  *If you need a refill on your cardiac medications before your next appointment, please call your pharmacy*  Lab Work: None If you have labs (blood work) drawn today and your tests are completely normal, you will receive your results only by: MyChart Message (if you have MyChart) OR A paper copy in the mail If you have any lab test that is abnormal or we need to change your treatment, we will call you to review the results.  Testing/Procedures: None  Follow-Up: At [facility], you and your health needs are our priority.  As part of our continuing mission to provide you with exceptional heart care, our providers are all part of one team.  This team includes your primary Cardiologist (physician) and Advanced Practice Providers or APPs (Physician Assistants and Nurse Practitioners) who all work together to provide you with the care you need, when you need it.  Your next appointment:   1 year(s)  Provider:   You may see Alvan Carrier, MD or one of the following Advanced Practice Providers on your designated Care Team:   Laymon Qua, PA-C  [place], [place] Olivia [place], [place]     We recommend signing up for the patient portal called MyChart.  Sign up information is provided on this After Visit Summary.  MyChart is used to connect with patients for Virtual Visits (Telemedicine).  Patients are able to view lab/test results, encounter notes, upcoming appointments, etc.  Non-urgent messages can be sent to your provider as well.   To learn more about what you can do with MyChart, go to [email or web address].   Other Instructions Thank you for choosing [facility]!

## 2024-04-14 ENCOUNTER — Encounter: Payer: Self-pay | Admitting: General Practice

## 2024-07-09 ENCOUNTER — Ambulatory Visit: Admitting: Pulmonary Disease
